# Patient Record
Sex: Male | Born: 1950 | Race: White | Hispanic: No | Marital: Married | Smoking: Former smoker
Health system: Southern US, Community
[De-identification: ages and names within clinical notes are randomized; demographics above are authoritative.]

## PROBLEM LIST (undated history)

## (undated) DIAGNOSIS — J449 Chronic obstructive pulmonary disease, unspecified: Secondary | ICD-10-CM

## (undated) DIAGNOSIS — K219 Gastro-esophageal reflux disease without esophagitis: Secondary | ICD-10-CM

## (undated) DIAGNOSIS — M1990 Unspecified osteoarthritis, unspecified site: Secondary | ICD-10-CM

## (undated) HISTORY — PX: OTHER SURGICAL HISTORY: SHX169

## (undated) HISTORY — PX: APPENDECTOMY: SHX54

---

## 2007-02-12 ENCOUNTER — Ambulatory Visit (HOSPITAL_COMMUNITY): Admission: RE | Admit: 2007-02-12 | Discharge: 2007-02-12 | Payer: Self-pay | Admitting: General Surgery

## 2010-02-16 ENCOUNTER — Ambulatory Visit (HOSPITAL_COMMUNITY): Admission: RE | Admit: 2010-02-16 | Discharge: 2010-02-16 | Payer: Self-pay | Admitting: Urology

## 2010-08-22 ENCOUNTER — Ambulatory Visit (HOSPITAL_COMMUNITY)
Admission: RE | Admit: 2010-08-22 | Discharge: 2010-08-22 | Payer: Self-pay | Source: Home / Self Care | Attending: Urology | Admitting: Urology

## 2011-01-17 NOTE — H&P (Signed)
Darren Rivers, Darren NO.:  [id number]   MEDICAL RECORD NO.:  [id number]          PATIENT TYPE:  AMB   LOCATION:                                FACILITY:  [facility]   PHYSICIAN:  Dalia Heading, M.D.  DATE OF BIRTH:  02/01/1951   DATE OF ADMISSION:  02/12/2007  DATE OF DISCHARGE:  LH                              HISTORY & PHYSICAL   CHIEF COMPLAINT:  Dysphagia, GERD.   HISTORY OF PRESENT ILLNESS:  The patient is a 60 year old white male who  is referred for endoscopic evaluation.  He needs an EGD for recurrent  dysphagia and GERD.  No fever, chills, weight loss, melena, diarrhea, or  constipation have been noted.  He last had an EGD in 2004 and states she  was doing well until the last few months.  He feels like something is  getting stuck in his throat.   PAST MEDICAL HISTORY:  Remarkable for hypercholesterolemia.   PAST SURGICAL HISTORY:  Knee surgery, shoulder surgery, and foot  surgeries in the past.   CURRENT MEDICATIONS:  Prilosec.   ALLERGIES:  No known drug allergies.   REVIEW OF SYSTEMS:  The patient does smoke a pack of cigarettes a day.  He denies any significant alcohol use.  He denies any other  cardiopulmonary difficulties or bleeding disorders.   PHYSICAL EXAMINATION:  GENERAL:  The patient is a well-developed, well-  nourished white male in no acute distress.  LUNGS:  Clear to auscultation with equal breath sounds bilaterally.  HEART:  A regular rate and rhythm without S3, S4, or murmurs.  ABDOMEN:  Soft, nontender, nondistended.  No hepatosplenomegaly or  masses are noted.   IMPRESSION:  1. Dysphagia.  2. Gastroesophageal reflux disease.   PLAN:  The patient is scheduled for an EGD on February 12, 2007.  The risks  and benefits of the procedure including bleeding and perforation were  fully explained to the patient, who gave informed consent.      Dalia Heading, M.D.  Electronically Signed     MAJ/MEDQ  D:  01/24/2007  T:   01/25/2007  Job:  [id number]   cc:   [facility]  Fax: [phone]   Selinda Flavin  Fax: [phone]

## 2011-02-21 ENCOUNTER — Ambulatory Visit (HOSPITAL_COMMUNITY)
Admission: RE | Admit: 2011-02-21 | Discharge: 2011-02-21 | Disposition: A | Discharge status: Home / Self Care | Payer: BC Managed Care – PPO | Source: Ambulatory Visit | Attending: Urology | Admitting: Urology

## 2011-02-21 ENCOUNTER — Other Ambulatory Visit (HOSPITAL_COMMUNITY): Payer: Self-pay | Admitting: Urology

## 2011-02-21 DIAGNOSIS — N200 Calculus of kidney: Secondary | ICD-10-CM

## 2011-02-21 DIAGNOSIS — R109 Unspecified abdominal pain: Secondary | ICD-10-CM | POA: Insufficient documentation

## 2011-06-22 LAB — CLOTEST (H. PYLORI), BIOPSY: Helicobacter screen: NEGATIVE

## 2011-07-04 IMAGING — CR DG ABDOMEN 1V
2 series · 2 of 2 positions shown · non-contrast
Comparison: None

CLINICAL DATA: Left ureteral and bilateral renal calculi

ABDOMEN - 1 VIEW

[view not recorded (1 of 2)]
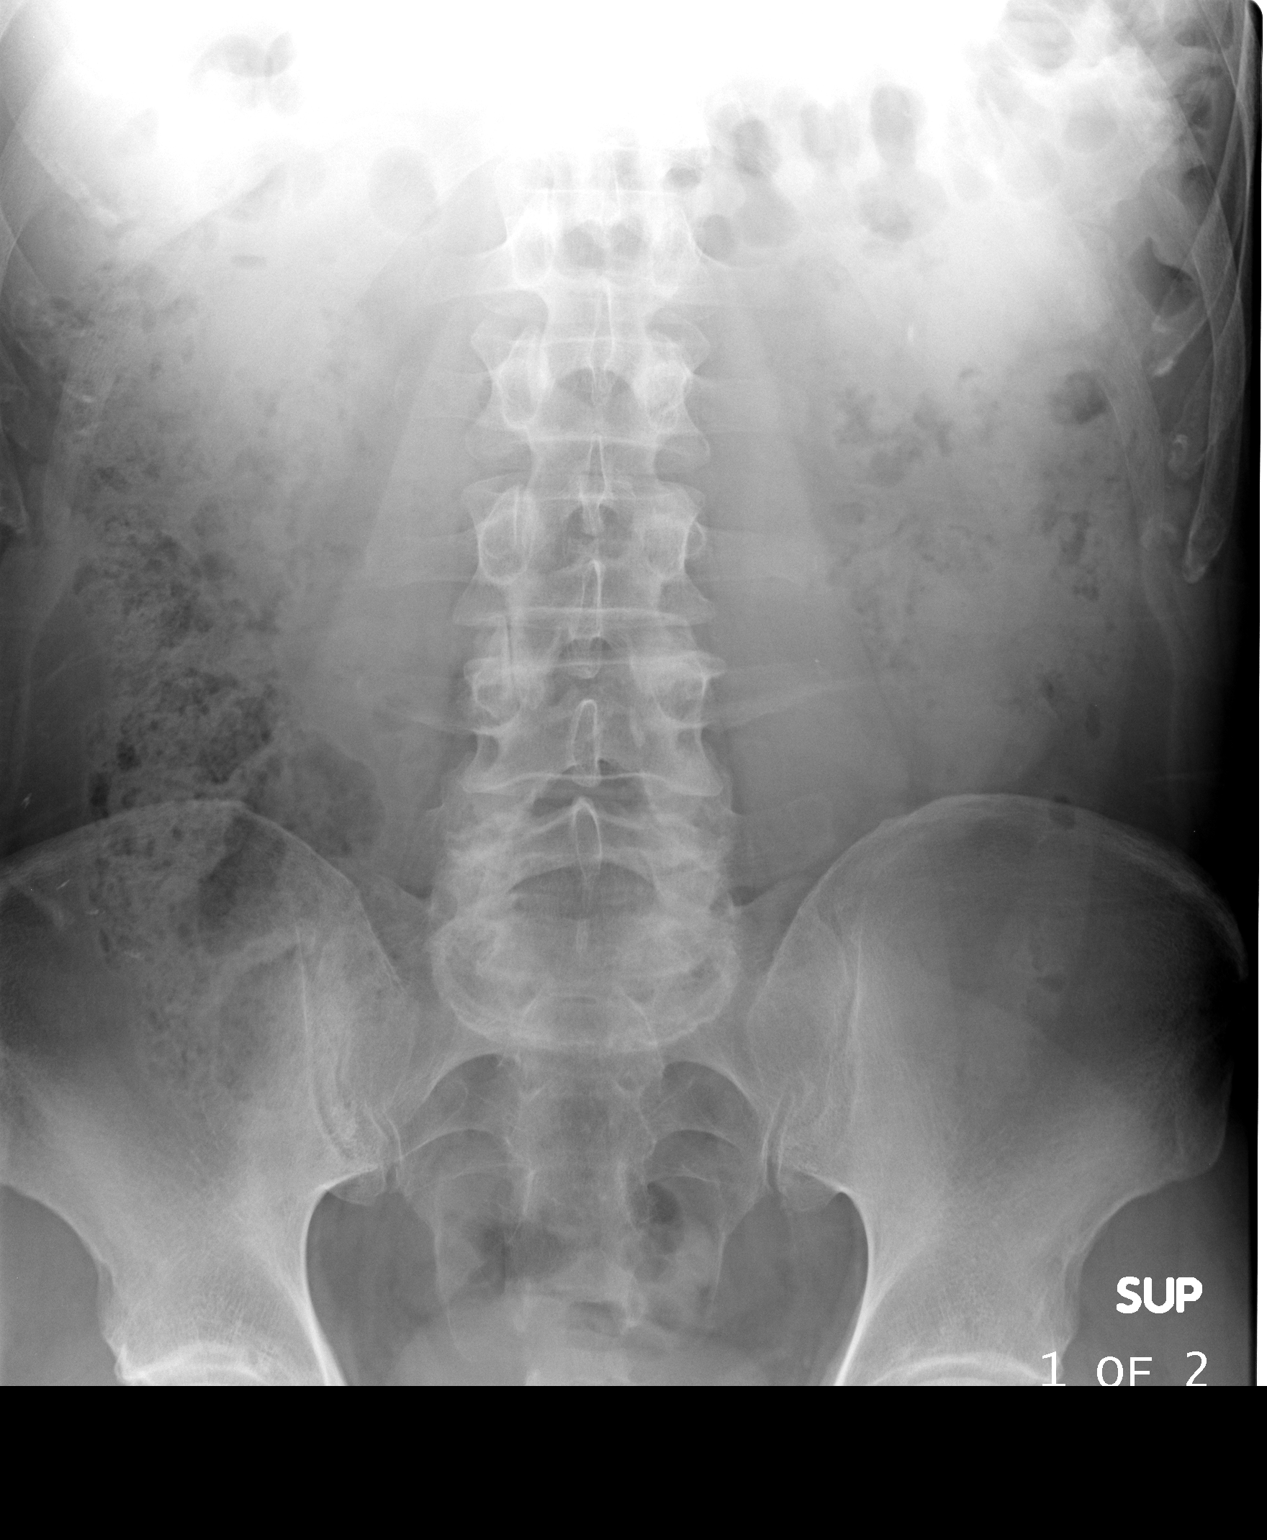

[view not recorded (2 of 2)]
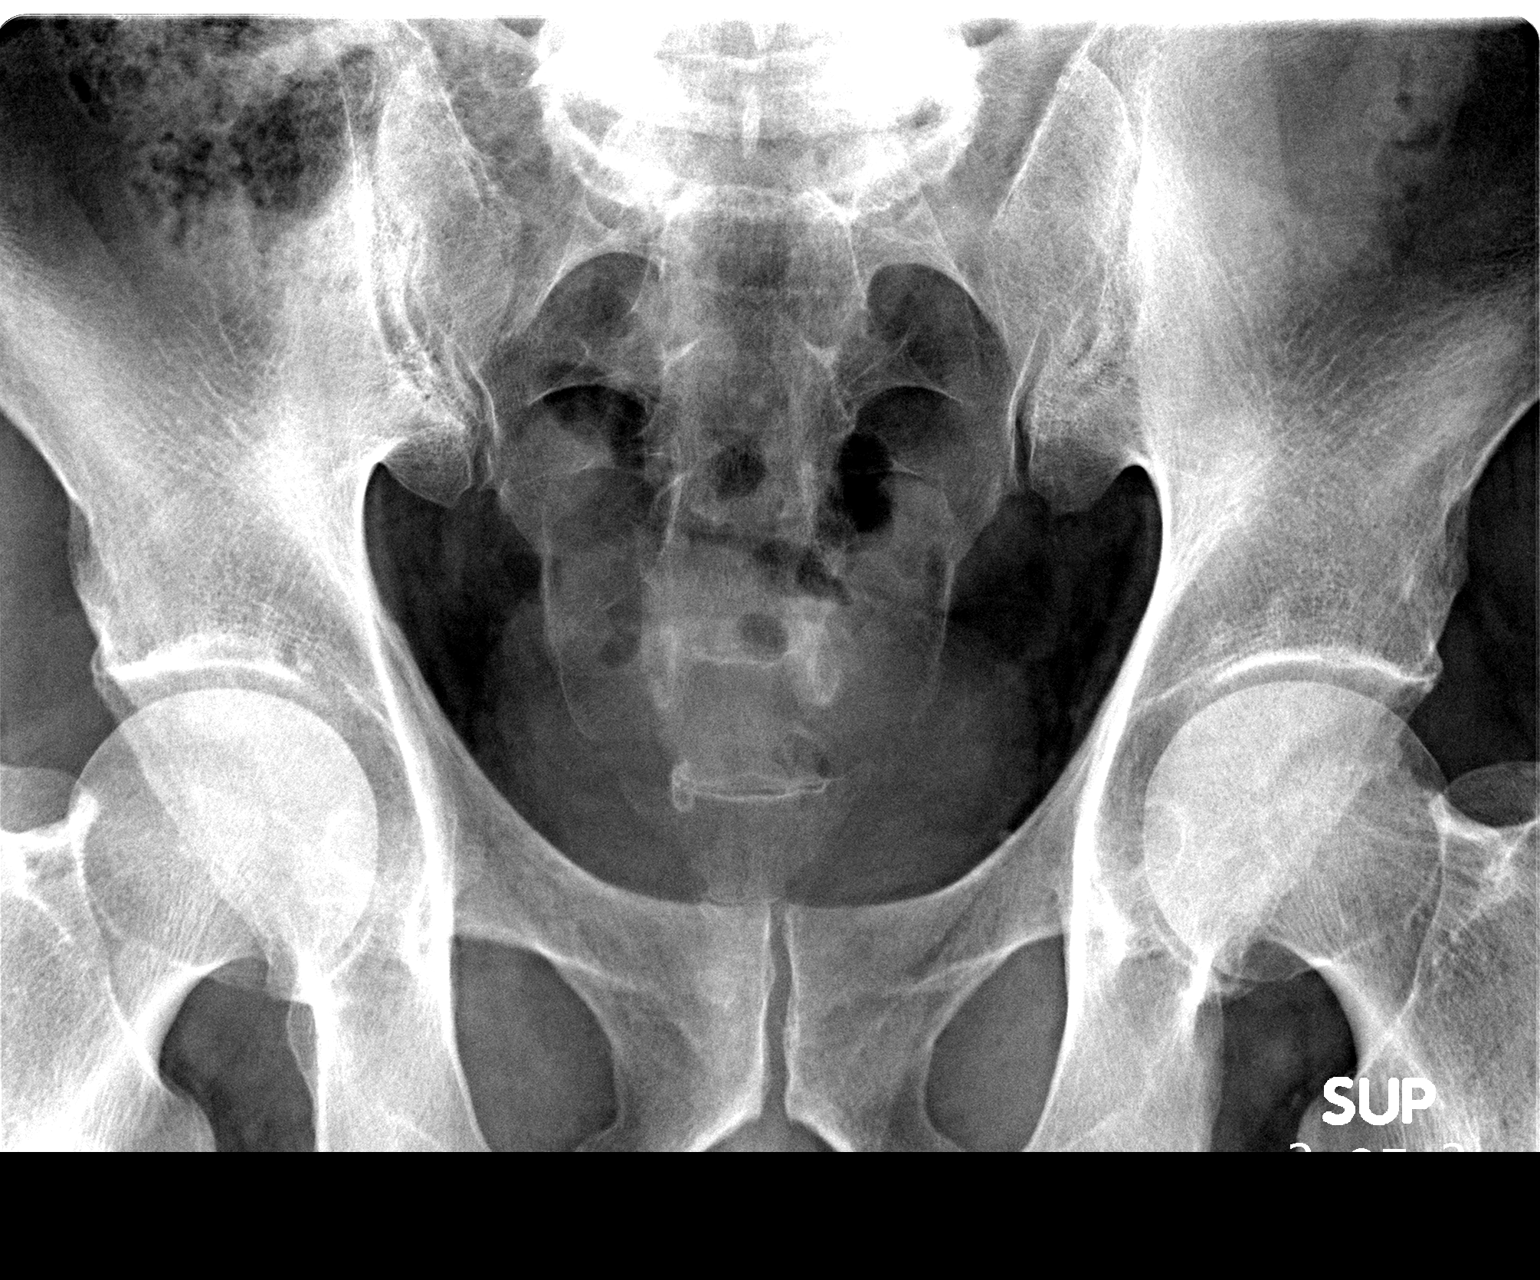

[2 of 2 positions shown; findings below may reference images not displayed]

FINDINGS: 5.2 x 1.4 mm diameter calculus left kidney.
No definite right-sided renal calcification seen.
Small pelvic phleboliths.
No ureteral calcifications identified.
Bones appear demineralized.
Bowel gas pattern normal.
IMPRESSION: 5.2 x 1.4 mm diameter left renal calculus.
No other definite urinary tract calcifications are seen.

## 2011-09-12 ENCOUNTER — Ambulatory Visit (HOSPITAL_COMMUNITY)
Admission: RE | Admit: 2011-09-12 | Discharge: 2011-09-12 | Disposition: A | Discharge status: Home / Self Care | Payer: BC Managed Care – PPO | Source: Ambulatory Visit | Attending: Urology | Admitting: Urology

## 2011-09-12 ENCOUNTER — Other Ambulatory Visit (HOSPITAL_COMMUNITY): Payer: Self-pay | Admitting: Urology

## 2011-09-12 DIAGNOSIS — N200 Calculus of kidney: Secondary | ICD-10-CM

## 2011-09-12 DIAGNOSIS — R109 Unspecified abdominal pain: Secondary | ICD-10-CM | POA: Insufficient documentation

## 2012-07-08 IMAGING — CR DG ABDOMEN 1V
1 series · 1 of 1 positions shown · non-contrast
Comparison: 08/22/2010

CLINICAL DATA: Bilateral renal calculi, follow-up

ABDOMEN - 1 VIEW

[view not recorded]
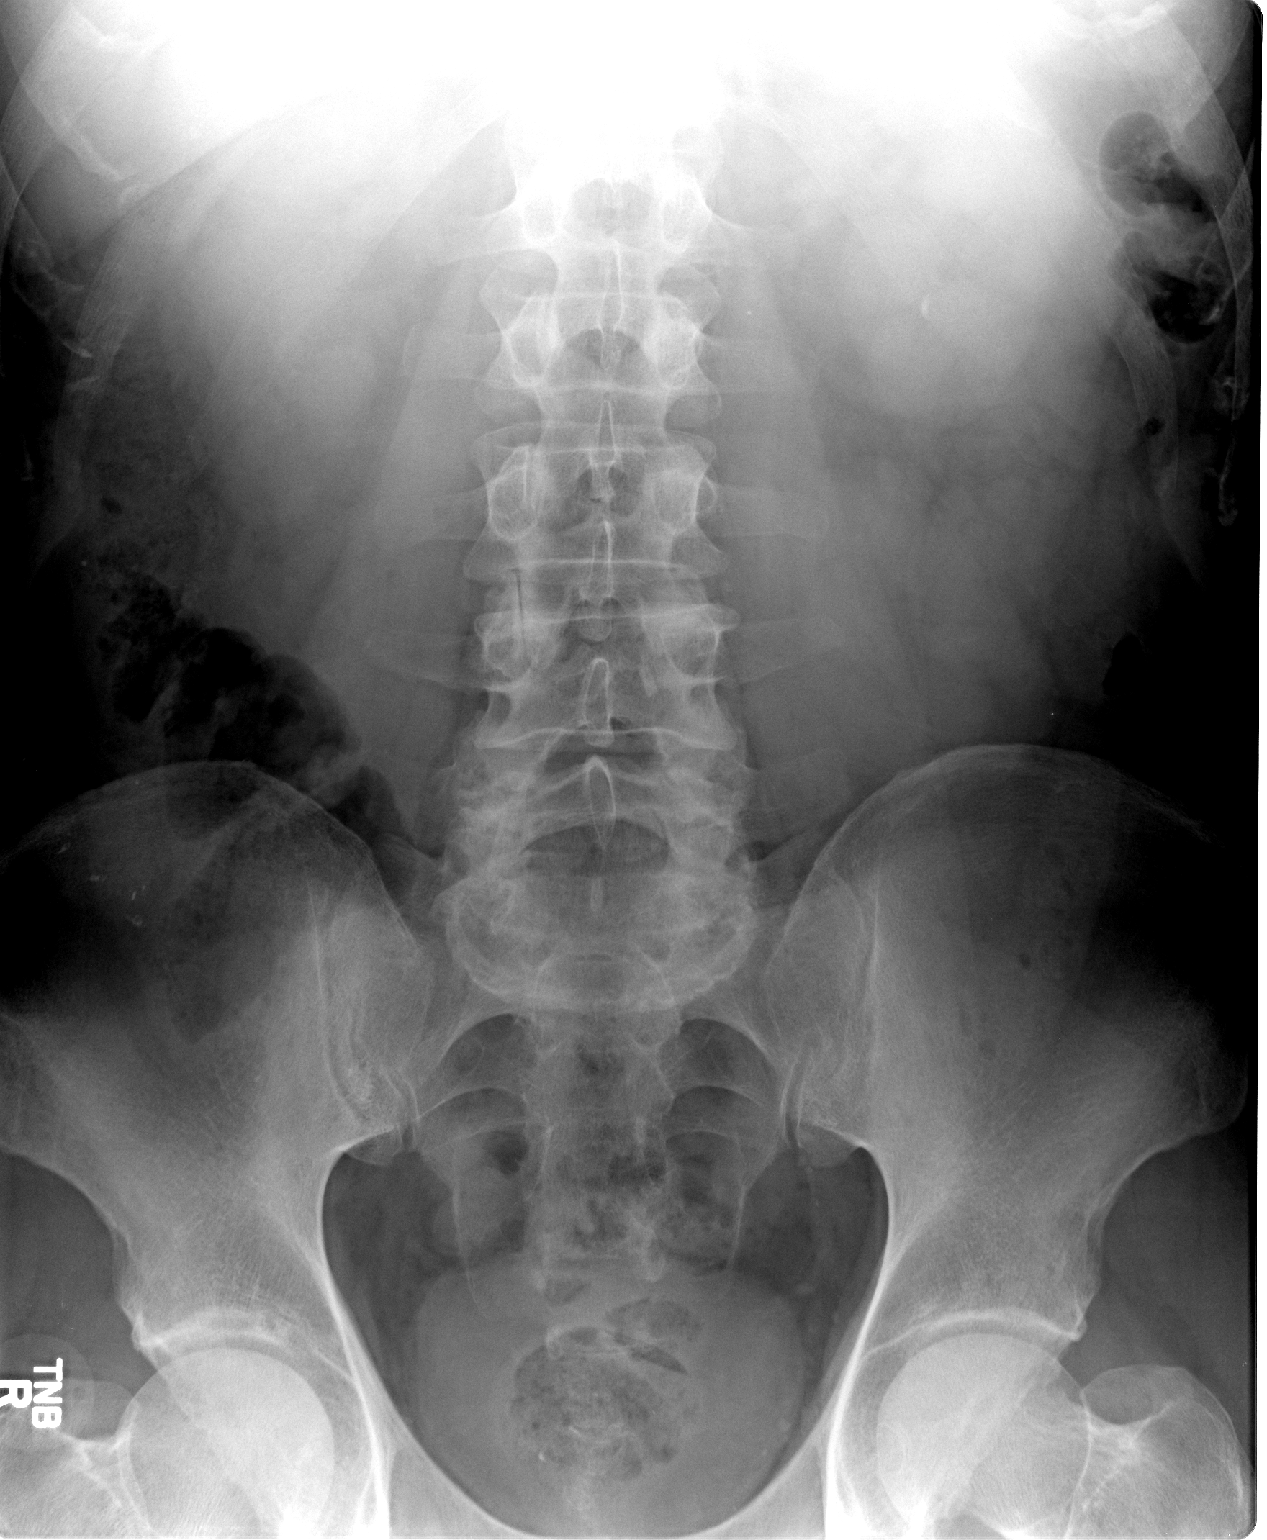

[1 of 1 positions shown; findings below may reference images not displayed]

FINDINGS: Bilateral pelvic phleboliths.
Tiny left paraspinal calcification superior endplate L2 unchanged,
likely extra urinary.
7 mm diameter calculus lower pole left kidney noted.
Questionable 2 mm diameter vague calculus mid right kidney versus
bowel artifact.
No potential ureteral calcification seen.
Bowel gas pattern normal.
Osseous structures unremarkable.
IMPRESSION: 7 mm left renal calculus.
Questionable 2 mm right renal calculus.

## 2015-05-11 ENCOUNTER — Ambulatory Visit: Payer: Self-pay | Admitting: Orthopedic Surgery

## 2015-05-11 NOTE — Progress Notes (Signed)
Preoperative surgical orders have been place into the [facility] hospital system for [facility] on 05/11/2015, 7:55 AM  by Mickel Crow for surgery on 05-31-2015.  Preop Total Knee orders including Experal, IV Tylenol, and IV Decadron as long as there are no contraindications to the above medications. Arlee Muslim, PA-C

## 2015-05-24 NOTE — Patient Instructions (Signed)
YOUR PROCEDURE IS SCHEDULED ON :  05/31/15  REPORT TO [facility] MAIN ENTRANCE FOLLOW SIGNS TO EAST ELEVATOR - GO TO 3rd FLOOR CHECK IN AT 3 EAST NURSES STATION (SHORT STAY) AT:  5:15 AM  CALL THIS NUMBER IF YOU HAVE PROBLEMS THE MORNING OF SURGERY [phone]  REMEMBER:ONLY 1 PER PERSON MAY GO TO SHORT STAY WITH YOU TO GET READY THE MORNING OF YOUR SURGERY  DO NOT EAT FOOD OR DRINK LIQUIDS AFTER MIDNIGHT  TAKE THESE MEDICINES THE MORNING OF SURGERY:  YOU MAY NOT HAVE ANY METAL ON YOUR BODY INCLUDING HAIR PINS AND PIERCING'S. DO NOT WEAR JEWELRY, MAKEUP, LOTIONS, POWDERS OR PERFUMES. DO NOT WEAR NAIL POLISH. DO NOT SHAVE 48 HRS PRIOR TO SURGERY. MEN MAY SHAVE FACE AND NECK.  DO NOT [place]. [place] IS NOT RESPONSIBLE FOR VALUABLES.  CONTACTS, DENTURES OR PARTIALS MAY NOT BE WORN TO SURGERY. LEAVE SUITCASE IN CAR. CAN BE BROUGHT TO ROOM AFTER SURGERY.  PATIENTS DISCHARGED THE DAY OF SURGERY WILL NOT BE ALLOWED TO DRIVE HOME.  PLEASE READ OVER THE FOLLOWING INSTRUCTION SHEETS _________________________________________________________________________________                                          [place] - PREPARING FOR SURGERY  Before surgery, you can play an important role.  Because skin is not sterile, your skin needs to be as free of germs as possible.  You can reduce the number of germs on your skin by washing with CHG (chlorahexidine gluconate) soap before surgery.  CHG is an antiseptic cleaner which kills germs and bonds with the skin to continue killing germs even after washing. Please DO NOT use if you have an allergy to CHG or antibacterial soaps.  If your skin becomes reddened/irritated stop using the CHG and inform your nurse when you arrive at Short Stay. Do not shave (including legs and underarms) for at least 48 hours prior to the first CHG shower.  You may shave your face. Please follow these instructions carefully:   1.   Shower with CHG Soap the night before surgery and the  morning of Surgery.   2.  If you choose to wash your hair, wash your hair first as usual with your  normal  Shampoo.   3.  After you shampoo, rinse your hair and body thoroughly to remove the  shampoo.                                         4.  Use CHG as you would any other liquid soap.  You can apply chg directly  to the skin and wash . Gently wash with scrungie or clean wascloth    5.  Apply the CHG Soap to your body ONLY FROM THE NECK DOWN.   Do not use on open                           Wound or open sores. Avoid contact with eyes, ears mouth and genitals (private parts).                        Genitals (private parts) with your normal soap.  6.  Wash thoroughly, paying special attention to the area where your surgery  will be performed.   7.  Thoroughly rinse your body with warm water from the neck down.   8.  DO NOT shower/wash with your normal soap after using and rinsing off  the CHG Soap .                9.  Pat yourself dry with a clean towel.             10.  Wear clean night clothes to bed after shower             11.  Place clean sheets on your bed the night of your first shower and do not  sleep with pets.  Day of Surgery : Do not apply any lotions/deodorants the morning of surgery.  Please wear clean clothes to the hospital/surgery center.  FAILURE TO FOLLOW THESE INSTRUCTIONS MAY RESULT IN THE CANCELLATION OF YOUR SURGERY    PATIENT SIGNATURE_________________________________  ______________________________________________________________________     Adam Phenix  An incentive spirometer is a tool that can help keep your lungs clear and active. This tool measures how well you are filling your lungs with each breath. Taking long deep breaths may help reverse or decrease the chance of developing breathing (pulmonary) problems (especially infection) following:  A long period of time  when you are unable to move or be active. BEFORE THE PROCEDURE   If the spirometer includes an indicator to show your best effort, your nurse or respiratory therapist will set it to a desired goal.  If possible, sit up straight or lean slightly forward. Try not to slouch.  Hold the incentive spirometer in an upright position. INSTRUCTIONS FOR USE   Sit on the edge of your bed if possible, or sit up as far as you can in bed or on a chair.  Hold the incentive spirometer in an upright position.  Breathe out normally.  Place the mouthpiece in your mouth and seal your lips tightly around it.  Breathe in slowly and as deeply as possible, raising the piston or the ball toward the top of the column.  Hold your breath for 3-5 seconds or for as long as possible. Allow the piston or ball to fall to the bottom of the column.  Remove the mouthpiece from your mouth and breathe out normally.  Rest for a few seconds and repeat Steps 1 through 7 at least 10 times every 1-2 hours when you are awake. Take your time and take a few normal breaths between deep breaths.  The spirometer may include an indicator to show your best effort. Use the indicator as a goal to work toward during each repetition.  After each set of 10 deep breaths, practice coughing to be sure your lungs are clear. If you have an incision (the cut made at the time of surgery), support your incision when coughing by placing a pillow or rolled up towels firmly against it. Once you are able to get out of bed, walk around indoors and cough well. You may stop using the incentive spirometer when instructed by your caregiver.  RISKS AND COMPLICATIONS  Take your time so you do not get dizzy or light-headed.  If you are in pain, you may need to take or ask for pain medication before doing incentive spirometry. It is harder to take a deep breath if you are having pain. AFTER USE  Rest and breathe slowly and easily.  It can be helpful to  keep track of a log of your progress. Your caregiver can provide you with a simple table to help with this. If you are using the spirometer at home, follow these instructions: [place] IF:   You are having difficultly using the spirometer.  You have trouble using the spirometer as often as instructed.  Your pain medication is not giving enough relief while using the spirometer.  You develop fever of 100.5 F (38.1 C) or higher. SEEK IMMEDIATE MEDICAL CARE IF:   You cough up bloody sputum that had not been present before.  You develop fever of 102 F (38.9 C) or greater.  You develop worsening pain at or near the incision site. MAKE SURE YOU:   Understand these instructions.  Will watch your condition.  Will get help right away if you are not doing well or get worse. Document Released: 01/01/2007 Document Revised: 11/13/2011 Document Reviewed: 03/04/2007 [employer] Patient Information 2014 [employer], [place].   ________________________________________________________________________  WHAT IS A BLOOD TRANSFUSION? Blood Transfusion Information  A transfusion is the replacement of blood or some of its parts. Blood is made up of multiple cells which provide different functions.  Red blood cells carry oxygen and are used for blood loss replacement.  White blood cells fight against infection.  Platelets control bleeding.  Plasma helps clot blood.  Other blood products are available for specialized needs, such as hemophilia or other clotting disorders. BEFORE THE TRANSFUSION  Who gives blood for transfusions?   Healthy volunteers who are fully evaluated to make sure their blood is safe. This is blood bank blood. Transfusion therapy is the safest it has ever been in the practice of medicine. Before blood is taken from a donor, a complete history is taken to make sure that person has no history of diseases nor engages in risky social behavior (examples are intravenous drug  use or sexual activity with multiple partners). The donor's travel history is screened to minimize risk of transmitting infections, such as malaria. The donated blood is tested for signs of infectious diseases, such as HIV and hepatitis. The blood is then tested to be sure it is compatible with you in order to minimize the chance of a transfusion reaction. If you or a relative donates blood, this is often done in anticipation of surgery and is not appropriate for emergency situations. It takes many days to process the donated blood. RISKS AND COMPLICATIONS Although transfusion therapy is very safe and saves many lives, the main dangers of transfusion include:   Getting an infectious disease.  Developing a transfusion reaction. This is an allergic reaction to something in the blood you were given. Every precaution is taken to prevent this. The decision to have a blood transfusion has been considered carefully by your caregiver before blood is given. Blood is not given unless the benefits outweigh the risks. AFTER THE TRANSFUSION  Right after receiving a blood transfusion, you will usually feel much better and more energetic. This is especially true if your red blood cells have gotten low (anemic). The transfusion raises the level of the red blood cells which carry oxygen, and this usually causes an energy increase.  The nurse administering the transfusion will monitor you carefully for complications. HOME CARE INSTRUCTIONS  No special instructions are needed after a transfusion. You may find your energy is better. Speak with your caregiver about any limitations on activity for underlying diseases you may have. SEEK MEDICAL CARE IF:   Your  condition is not improving after your transfusion.  You develop redness or irritation at the intravenous (IV) site. SEEK IMMEDIATE MEDICAL CARE IF:  Any of the following symptoms occur over the next 12 hours:  Shaking chills.  You have a temperature by mouth  above 102 F (38.9 C), not controlled by medicine.  Chest, back, or muscle pain.  People around you feel you are not acting correctly or are confused.  Shortness of breath or difficulty breathing.  Dizziness and fainting.  You get a rash or develop hives.  You have a decrease in urine output.  Your urine turns a dark color or changes to pink, red, or brown. Any of the following symptoms occur over the next 10 days:  You have a temperature by mouth above 102 F (38.9 C), not controlled by medicine.  Shortness of breath.  Weakness after normal activity.  The white part of the eye turns yellow (jaundice).  You have a decrease in the amount of urine or are urinating less often.  Your urine turns a dark color or changes to pink, red, or brown. Document Released: 08/18/2000 Document Revised: 11/13/2011 Document Reviewed: 04/06/2008 [facility] [place] Patient Information 2014 [place], [place].  _______________________________________________________________________

## 2015-05-25 ENCOUNTER — Inpatient Hospital Stay (HOSPITAL_COMMUNITY)
Admission: RE | Admit: 2015-05-25 | Discharge: 2015-05-25 | Disposition: A | Discharge status: Home / Self Care | Payer: PRIVATE HEALTH INSURANCE | Source: Ambulatory Visit

## 2015-05-25 NOTE — Patient Instructions (Signed)
Darren Rivers  05/25/2015   Your procedure is scheduled on:   05/31/2015    Report to [facility] Main  Entrance take [facility]  elevators to 3rd floor to  [place] at      0600 AM.  Call this number if you have problems the morning of surgery [phone]   Remember: ONLY 1 PERSON MAY GO WITH YOU TO SHORT STAY TO GET  READY MORNING OF [place].  Do not eat food or drink liquids :After Midnight.     Take these medicines the morning of surgery with A SIP OF WATER: Cosopt eye drops, Prilosec                                You may not have any metal on your body including hair pins and              piercings  Do not wear jewelry,  lotions, powders or perfumes, deodorant                        Men may shave face and neck.   Do not bring valuables to the hospital. [place].  Contacts, dentures or bridgework may not be worn into surgery.  Leave suitcase in the car. After surgery it may be brought to your room.       Special Instructions: coughing and deep breathing exercises, leg exercises               Please read over the following fact sheets you were given: _____________________________________________________________________             [facility] - Preparing for Surgery Before surgery, you can play an important role.  Because skin is not sterile, your skin needs to be as free of germs as possible.  You can reduce the number of germs on your skin by washing with CHG (chlorahexidine gluconate) soap before surgery.  CHG is an antiseptic cleaner which kills germs and bonds with the skin to continue killing germs even after washing. Please DO NOT use if you have an allergy to CHG or antibacterial soaps.  If your skin becomes reddened/irritated stop using the CHG and inform your nurse when you arrive at Short Stay. Do not shave (including legs and underarms) for at least 48 hours prior to the first CHG  shower.  You may shave your face/neck. Please follow these instructions carefully:  1.  Shower with CHG Soap the night before surgery and the  morning of Surgery.  2.  If you choose to wash your hair, wash your hair first as usual with your  normal  shampoo.  3.  After you shampoo, rinse your hair and body thoroughly to remove the  shampoo.                           4.  Use CHG as you would any other liquid soap.  You can apply chg directly  to the skin and wash                       Gently with a scrungie or clean washcloth.  5.  Apply the CHG Soap to your body ONLY FROM THE NECK DOWN.   Do not use on face/ open                           Wound or open sores. Avoid contact with eyes, ears mouth and genitals (private parts).                       Wash face,  Genitals (private parts) with your normal soap.             6.  Wash thoroughly, paying special attention to the area where your surgery  will be performed.  7.  Thoroughly rinse your body with warm water from the neck down.  8.  DO NOT shower/wash with your normal soap after using and rinsing off  the CHG Soap.                9.  Pat yourself dry with a clean towel.            10.  Wear clean pajamas.            11.  Place clean sheets on your bed the night of your first shower and do not  sleep with pets. Day of Surgery : Do not apply any lotions/deodorants the morning of surgery.  Please wear clean clothes to the hospital/surgery center.  FAILURE TO FOLLOW THESE INSTRUCTIONS MAY RESULT IN THE CANCELLATION OF YOUR SURGERY PATIENT SIGNATURE_________________________________  NURSE SIGNATURE__________________________________  ________________________________________________________________________  WHAT IS A BLOOD TRANSFUSION? Blood Transfusion Information  A transfusion is the replacement of blood or some of its parts. Blood is made up of multiple cells which provide different functions.  Red blood cells carry oxygen and are used for  blood loss replacement.  White blood cells fight against infection.  Platelets control bleeding.  Plasma helps clot blood.  Other blood products are available for specialized needs, such as hemophilia or other clotting disorders. BEFORE THE TRANSFUSION  Who gives blood for transfusions?   Healthy volunteers who are fully evaluated to make sure their blood is safe. This is blood bank blood. Transfusion therapy is the safest it has ever been in the practice of medicine. Before blood is taken from a donor, a complete history is taken to make sure that person has no history of diseases nor engages in risky social behavior (examples are intravenous drug use or sexual activity with multiple partners). The donor's travel history is screened to minimize risk of transmitting infections, such as malaria. The donated blood is tested for signs of infectious diseases, such as HIV and hepatitis. The blood is then tested to be sure it is compatible with you in order to minimize the chance of a transfusion reaction. If you or a relative donates blood, this is often done in anticipation of surgery and is not appropriate for emergency situations. It takes many days to process the donated blood. RISKS AND COMPLICATIONS Although transfusion therapy is very safe and saves many lives, the main dangers of transfusion include:  1. Getting an infectious disease. 2. Developing a transfusion reaction. This is an allergic reaction to something in the blood you were given. Every precaution is taken to prevent this. The decision to have a blood transfusion has been considered carefully by your caregiver before blood is given. Blood is not given unless the benefits outweigh the risks. AFTER THE TRANSFUSION  Right after receiving a  blood transfusion, you will usually feel much better and more energetic. This is especially true if your red blood cells have gotten low (anemic). The transfusion raises the level of the red blood  cells which carry oxygen, and this usually causes an energy increase.  The nurse administering the transfusion will monitor you carefully for complications. HOME CARE INSTRUCTIONS  No special instructions are needed after a transfusion. You may find your energy is better. Speak with your caregiver about any limitations on activity for underlying diseases you may have. SEEK MEDICAL CARE IF:   Your condition is not improving after your transfusion.  You develop redness or irritation at the intravenous (IV) site. SEEK IMMEDIATE MEDICAL CARE IF:  Any of the following symptoms occur over the next 12 hours:  Shaking chills.  You have a temperature by mouth above 102 F (38.9 C), not controlled by medicine.  Chest, back, or muscle pain.  People around you feel you are not acting correctly or are confused.  Shortness of breath or difficulty breathing.  Dizziness and fainting.  You get a rash or develop hives.  You have a decrease in urine output.  Your urine turns a dark color or changes to pink, red, or brown. Any of the following symptoms occur over the next 10 days:  You have a temperature by mouth above 102 F (38.9 C), not controlled by medicine.  Shortness of breath.  Weakness after normal activity.  The white part of the eye turns yellow (jaundice).  You have a decrease in the amount of urine or are urinating less often.  Your urine turns a dark color or changes to pink, red, or brown. Document Released: 08/18/2000 Document Revised: 11/13/2011 Document Reviewed: 04/06/2008 ExitCare Patient Information 2014 [place].  _______________________________________________________________________  Incentive Spirometer  An incentive spirometer is a tool that can help keep your lungs clear and active. This tool measures how well you are filling your lungs with each breath. Taking long deep breaths may help reverse or decrease the chance of developing breathing  (pulmonary) problems (especially infection) following:  A long period of time when you are unable to move or be active. BEFORE THE PROCEDURE   If the spirometer includes an indicator to show your best effort, your nurse or respiratory therapist will set it to a desired goal.  If possible, sit up straight or lean slightly forward. Try not to slouch.  Hold the incentive spirometer in an upright position. INSTRUCTIONS FOR USE  3. Sit on the edge of your bed if possible, or sit up as far as you can in bed or on a chair. 4. Hold the incentive spirometer in an upright position. 5. Breathe out normally. 6. Place the mouthpiece in your mouth and seal your lips tightly around it. 7. Breathe in slowly and as deeply as possible, raising the piston or the ball toward the top of the column. 8. Hold your breath for 3-5 seconds or for as long as possible. Allow the piston or ball to fall to the bottom of the column. 9. Remove the mouthpiece from your mouth and breathe out normally. 10. Rest for a few seconds and repeat Steps 1 through 7 at least 10 times every 1-2 hours when you are awake. Take your time and take a few normal breaths between deep breaths. 11. The spirometer may include an indicator to show your best effort. Use the indicator as a goal to work toward during each repetition. 12. After each set of 10  deep breaths, practice coughing to be sure your lungs are clear. If you have an incision (the cut made at the time of surgery), support your incision when coughing by placing a pillow or rolled up towels firmly against it. Once you are able to get out of bed, walk around indoors and cough well. You may stop using the incentive spirometer when instructed by your caregiver.  RISKS AND COMPLICATIONS  Take your time so you do not get dizzy or light-headed.  If you are in pain, you may need to take or ask for pain medication before doing incentive spirometry. It is harder to take a deep breath if you  are having pain. AFTER USE  Rest and breathe slowly and easily.  It can be helpful to keep track of a log of your progress. Your caregiver can provide you with a simple table to help with this. If you are using the spirometer at home, follow these instructions: [place] IF:   You are having difficultly using the spirometer.  You have trouble using the spirometer as often as instructed.  Your pain medication is not giving enough relief while using the spirometer.  You develop fever of 100.5 F (38.1 C) or higher. SEEK IMMEDIATE MEDICAL CARE IF:   You cough up bloody sputum that had not been present before.  You develop fever of 102 F (38.9 C) or greater.  You develop worsening pain at or near the incision site. MAKE SURE YOU:   Understand these instructions.  Will watch your condition.  Will get help right away if you are not doing well or get worse. Document Released: 01/01/2007 Document Revised: 11/13/2011 Document Reviewed: 03/04/2007 [facility] Patient Information 2014 [place], [place].   ________________________________________________________________________

## 2015-05-26 NOTE — H&P (Signed)
TOTAL KNEE ADMISSION H&P  Patient is being admitted for left total knee arthroplasty.  Subjective:  Chief Complaint:left knee pain.  HPI: Darren Rivers, 64 y.o. male, has a history of pain and functional disability in the left knee due to arthritis and has failed non-surgical conservative treatments for greater than 12 weeks to includeNSAID's and/or analgesics, corticosteriod injections and activity modification.  Onset of symptoms was gradual, starting >10 years ago with gradually worsening course since that time. The patient noted prior procedures on the knee to include  arthroscopy and menisectomy on the left knee(s).  Patient currently rates pain in the left knee(s) at 7 out of 10 with activity. Patient has night pain, worsening of pain with activity and weight bearing, pain that interferes with activities of daily living, pain with passive range of motion, crepitus and joint swelling.  Patient has evidence of periarticular osteophytes, joint subluxation and joint space narrowing by imaging studies.  There is no active infection.   Past Surgical History  Rotator Cuff Repair right Tonsillectomy Vasectomy Foot Surgery bilateral Appendectomy Arthroscopy of Knee bilateral Colon Polyp Removal - Colonoscopy  Past Medical History Emphysema Of Lung Kidney Stone Glaucoma Hyperlipidemia GERD Varicose Veins Osteoarthritis   Current outpatient prescriptions:  .  aspirin 81 MG tablet, Take 81 mg by mouth daily., Disp: , Rfl:  .  atorvastatin (LIPITOR) 40 MG tablet, Take 20 mg by mouth daily., Disp: , Rfl: 3 .  dorzolamide-timolol (COSOPT) 22.3-6.8 MG/ML ophthalmic solution, Place 1 drop into both eyes 2 (two) times daily., Disp: , Rfl: 99 .  omeprazole (PRILOSEC) 20 MG capsule, Take 20 mg by mouth daily., Disp: , Rfl: 3   No Known Allergies  Social History  Substance Use Topics  . Smoking status: Past  . Smokeless tobacco: No  . Alcohol Use: "occasional" social        Review of Systems  Constitutional: Negative.   HENT: Positive for hearing loss. Negative for congestion, ear discharge, ear pain, nosebleeds, sore throat and tinnitus.   Eyes: Negative.   Respiratory: Positive for shortness of breath. Negative for cough, hemoptysis, sputum production, wheezing and stridor.        SOB with exertion  Cardiovascular: Negative.   Gastrointestinal: Positive for heartburn. Negative for nausea, vomiting, abdominal pain, diarrhea, constipation, blood in stool and melena.  Genitourinary: Negative.   Musculoskeletal: Positive for joint pain. Negative for myalgias, back pain, falls and neck pain.       Left knee pain  Skin: Negative.   Neurological: Negative.  Negative for headaches.  Endo/Heme/Allergies: Negative.   Psychiatric/Behavioral: Negative.     Objective:  Physical Exam  Constitutional: He is oriented to person, place, and time. He appears well-developed and well-nourished. No distress.  HENT:  Head: Normocephalic and atraumatic.  Right Ear: External ear normal.  Left Ear: External ear normal.  Nose: Nose normal.  Mouth/Throat: Oropharynx is clear and moist.  Eyes: Conjunctivae and EOM are normal.  Neck: Normal range of motion. Neck supple.  Cardiovascular: Normal rate, regular rhythm, normal heart sounds and intact distal pulses.   No murmur heard. Respiratory: Effort normal and breath sounds normal. No respiratory distress. He has no wheezes.  GI: Soft. Bowel sounds are normal. He exhibits no distension. There is no tenderness.  Musculoskeletal:  Evaluation of his hip show normal range motion, no discomfort. Right knee, no effusion. Right knee range is 0 to 135, with no tenderness or instability noted. Left knee, trace effusion, range about 5 to 125,  marked crepitus on range of motion, diffuse tenderness about the knee and fairly significant AP laxity with some slight varus and valgus laxity also  Neurological: He is alert and oriented to  person, place, and time. He has normal strength and normal reflexes. No sensory deficit.  Skin: No rash noted. He is not diaphoretic. No erythema.  Psychiatric: He has a normal mood and affect. His behavior is normal.     Vitals  Weight: 180 lb Height: 72in Body Surface Area: 2.04 m Body Mass Index: 24.41 kg/m  Pulse: 64 (Regular)  BP: 118/74 (Sitting, Left Arm, Standard)  Imaging Review Plain radiographs demonstrate severe degenerative joint disease of the left knee(s). The overall alignment ismild varus. The bone quality appears to be good for age and reported activity level.  Assessment/Plan:  End stage primary osteoarthritis, left knee   The patient history, physical examination, clinical judgment of the provider and imaging studies are consistent with end stage degenerative joint disease of the left knee(s) and total knee arthroplasty is deemed medically necessary. The treatment options including medical management, injection therapy arthroscopy and arthroplasty were discussed at length. The risks and benefits of total knee arthroplasty were presented and reviewed. The risks due to aseptic loosening, infection, stiffness, patella tracking problems, thromboembolic complications and other imponderables were discussed. The patient acknowledged the explanation, agreed to proceed with the plan and consent was signed. Patient is being admitted for inpatient treatment for surgery, pain control, PT, OT, prophylactic antibiotics, VTE prophylaxis, progressive ambulation and ADL's and discharge planning. The patient is planning to be discharged home with home health services    TXA IV Needs walker and shower chair  PCP: Dr. Rory Percy    Ardeen Jourdain, PA-C

## 2015-05-27 ENCOUNTER — Encounter (HOSPITAL_COMMUNITY)
Admission: RE | Admit: 2015-05-27 | Discharge: 2015-05-27 | Disposition: A | Discharge status: Home / Self Care | Payer: BLUE CROSS/BLUE SHIELD | Source: Ambulatory Visit | Attending: Orthopedic Surgery | Admitting: Orthopedic Surgery

## 2015-05-27 ENCOUNTER — Encounter (HOSPITAL_COMMUNITY): Payer: Self-pay

## 2015-05-27 DIAGNOSIS — Z01818 Encounter for other preprocedural examination: Secondary | ICD-10-CM | POA: Diagnosis present

## 2015-05-27 DIAGNOSIS — M179 Osteoarthritis of knee, unspecified: Secondary | ICD-10-CM | POA: Diagnosis not present

## 2015-05-27 HISTORY — DX: Chronic obstructive pulmonary disease, unspecified: J44.9

## 2015-05-27 HISTORY — DX: Unspecified osteoarthritis, unspecified site: M19.90

## 2015-05-27 HISTORY — DX: Gastro-esophageal reflux disease without esophagitis: K21.9

## 2015-05-27 LAB — URINALYSIS, ROUTINE W REFLEX MICROSCOPIC
BILIRUBIN URINE: NEGATIVE
GLUCOSE, UA: NEGATIVE mg/dL
Hgb urine dipstick: NEGATIVE
KETONES UR: NEGATIVE mg/dL
Leukocytes, UA: NEGATIVE
NITRITE: NEGATIVE
PH: 6.5 (ref 5.0–8.0)
PROTEIN: NEGATIVE mg/dL
Specific Gravity, Urine: 1.005 (ref 1.005–1.030)
Urobilinogen, UA: 0.2 mg/dL (ref 0.0–1.0)

## 2015-05-27 LAB — COMPREHENSIVE METABOLIC PANEL
ALBUMIN: 4.5 g/dL (ref 3.5–5.0)
ALT: 24 U/L (ref 17–63)
AST: 29 U/L (ref 15–41)
Alkaline Phosphatase: 79 U/L (ref 38–126)
Anion gap: 8 (ref 5–15)
BUN: 9 mg/dL (ref 6–20)
CHLORIDE: 107 mmol/L (ref 101–111)
CO2: 25 mmol/L (ref 22–32)
CREATININE: 0.94 mg/dL (ref 0.61–1.24)
Calcium: 9.6 mg/dL (ref 8.9–10.3)
GFR calc Af Amer: >60 mL/min (ref >60)
Glucose, Bld: 88 mg/dL (ref 65–99)
POTASSIUM: 4.7 mmol/L (ref 3.5–5.1)
SODIUM: 140 mmol/L (ref 135–145)
Total Bilirubin: 0.7 mg/dL (ref 0.3–1.2)
Total Protein: 7.7 g/dL (ref 6.5–8.1)

## 2015-05-27 LAB — CBC
HCT: 41.9 % (ref 39.0–52.0)
Hemoglobin: 14.2 g/dL (ref 13.0–17.0)
MCH: 33 pg (ref 26.0–34.0)
MCHC: 33.9 g/dL (ref 30.0–36.0)
MCV: 97.4 fL (ref 78.0–100.0)
PLATELETS: 243 10*3/uL (ref 150–400)
RBC: 4.3 MIL/uL (ref 4.22–5.81)
RDW: 13.3 % (ref 11.5–15.5)
WBC: 7.4 10*3/uL (ref 4.0–10.5)

## 2015-05-27 LAB — SURGICAL PCR SCREEN
MRSA, PCR: NEGATIVE
STAPHYLOCOCCUS AUREUS: NEGATIVE

## 2015-05-27 LAB — ABO/RH: ABO/RH(D): O POS

## 2015-05-27 LAB — APTT: APTT: 29 s (ref 24–37)

## 2015-05-27 LAB — PROTIME-INR
INR: 0.98 (ref 0.00–1.49)
PROTHROMBIN TIME: 13.2 s (ref 11.6–15.2)

## 2015-05-31 ENCOUNTER — Inpatient Hospital Stay (HOSPITAL_COMMUNITY)
Admission: RE | Admit: 2015-05-31 | Discharge: 2015-06-01 | DRG: 470 | Disposition: A | Discharge status: Home Health | Payer: BLUE CROSS/BLUE SHIELD | Source: Ambulatory Visit | Attending: Orthopedic Surgery | Admitting: Orthopedic Surgery

## 2015-05-31 ENCOUNTER — Encounter (HOSPITAL_COMMUNITY)
Admission: RE | Disposition: A | Discharge status: Home Health | Payer: Self-pay | Source: Ambulatory Visit | Attending: Orthopedic Surgery

## 2015-05-31 ENCOUNTER — Inpatient Hospital Stay (HOSPITAL_COMMUNITY): Payer: BLUE CROSS/BLUE SHIELD | Admitting: Anesthesiology

## 2015-05-31 ENCOUNTER — Encounter (HOSPITAL_COMMUNITY): Payer: Self-pay | Admitting: *Deleted

## 2015-05-31 DIAGNOSIS — M1710 Unilateral primary osteoarthritis, unspecified knee: Secondary | ICD-10-CM | POA: Diagnosis present

## 2015-05-31 DIAGNOSIS — H409 Unspecified glaucoma: Secondary | ICD-10-CM | POA: Diagnosis present

## 2015-05-31 DIAGNOSIS — Z87442 Personal history of urinary calculi: Secondary | ICD-10-CM

## 2015-05-31 DIAGNOSIS — Z01812 Encounter for preprocedural laboratory examination: Secondary | ICD-10-CM

## 2015-05-31 DIAGNOSIS — Z87891 Personal history of nicotine dependence: Secondary | ICD-10-CM | POA: Diagnosis not present

## 2015-05-31 DIAGNOSIS — E785 Hyperlipidemia, unspecified: Secondary | ICD-10-CM | POA: Diagnosis present

## 2015-05-31 DIAGNOSIS — M1712 Unilateral primary osteoarthritis, left knee: Secondary | ICD-10-CM

## 2015-05-31 DIAGNOSIS — M179 Osteoarthritis of knee, unspecified: Secondary | ICD-10-CM | POA: Diagnosis present

## 2015-05-31 DIAGNOSIS — J449 Chronic obstructive pulmonary disease, unspecified: Secondary | ICD-10-CM | POA: Diagnosis present

## 2015-05-31 DIAGNOSIS — K219 Gastro-esophageal reflux disease without esophagitis: Secondary | ICD-10-CM | POA: Diagnosis present

## 2015-05-31 DIAGNOSIS — M25562 Pain in left knee: Secondary | ICD-10-CM | POA: Diagnosis present

## 2015-05-31 HISTORY — PX: TOTAL KNEE ARTHROPLASTY: SHX125

## 2015-05-31 LAB — TYPE AND SCREEN
ABO/RH(D): O POS
ANTIBODY SCREEN: NEGATIVE

## 2015-05-31 SURGERY — ARTHROPLASTY, KNEE, TOTAL
Anesthesia: Spinal | Site: Knee | Laterality: Left

## 2015-05-31 MED ORDER — OXYCODONE HCL 5 MG PO TABS
5.0000 mg | ORAL_TABLET | ORAL | Status: DC | PRN
  Administered 2015-05-31 – 2015-06-01 (×7): 10 mg via ORAL
  Filled 2015-05-31 (×7): qty 2

## 2015-05-31 MED ORDER — SODIUM CHLORIDE 0.9 % IJ SOLN
INTRAMUSCULAR | Status: DC | PRN
  Administered 2015-05-31: 30 mL

## 2015-05-31 MED ORDER — MORPHINE SULFATE (PF) 2 MG/ML IV SOLN
1.0000 mg | INTRAVENOUS | Status: DC | PRN
  Administered 2015-05-31 (×3): 1 mg via INTRAVENOUS
  Filled 2015-05-31 (×3): qty 1

## 2015-05-31 MED ORDER — PHENOL 1.4 % MT LIQD
1.0000 | OROMUCOSAL | Status: DC | PRN
  Filled 2015-05-31: qty 177

## 2015-05-31 MED ORDER — LACTATED RINGERS IV SOLN
INTRAVENOUS | Status: DC | PRN
  Administered 2015-05-31 (×3): via INTRAVENOUS

## 2015-05-31 MED ORDER — METHOCARBAMOL 1000 MG/10ML IJ SOLN
500.0000 mg | Freq: Four times a day (QID) | INTRAVENOUS | Status: DC | PRN
  Administered 2015-05-31 (×2): 500 mg via INTRAVENOUS
  Filled 2015-05-31 (×3): qty 5

## 2015-05-31 MED ORDER — CEFAZOLIN SODIUM-DEXTROSE 2-3 GM-% IV SOLR
2.0000 g | Freq: Four times a day (QID) | INTRAVENOUS | Status: AC
  Administered 2015-05-31 (×2): 2 g via INTRAVENOUS
  Filled 2015-05-31 (×2): qty 50

## 2015-05-31 MED ORDER — CEFAZOLIN SODIUM-DEXTROSE 2-3 GM-% IV SOLR
INTRAVENOUS | Status: AC
  Filled 2015-05-31: qty 50

## 2015-05-31 MED ORDER — DIPHENHYDRAMINE HCL 12.5 MG/5ML PO ELIX: 12.5000 mg | ORAL_SOLUTION | ORAL | Status: DC | PRN

## 2015-05-31 MED ORDER — MIDAZOLAM HCL 2 MG/2ML IJ SOLN
INTRAMUSCULAR | Status: AC
  Filled 2015-05-31: qty 4

## 2015-05-31 MED ORDER — ATORVASTATIN CALCIUM 20 MG PO TABS
20.0000 mg | ORAL_TABLET | Freq: Every day | ORAL | Status: DC
  Administered 2015-06-01: 20 mg via ORAL
  Filled 2015-05-31: qty 1

## 2015-05-31 MED ORDER — RIVAROXABAN 10 MG PO TABS
10.0000 mg | ORAL_TABLET | Freq: Every day | ORAL | Status: DC
  Administered 2015-06-01: 10 mg via ORAL
  Filled 2015-05-31 (×2): qty 1

## 2015-05-31 MED ORDER — ONDANSETRON HCL 4 MG/2ML IJ SOLN
INTRAMUSCULAR | Status: DC | PRN
  Administered 2015-05-31: 4 mg via INTRAVENOUS

## 2015-05-31 MED ORDER — DOCUSATE SODIUM 100 MG PO CAPS
100.0000 mg | ORAL_CAPSULE | Freq: Two times a day (BID) | ORAL | Status: DC
  Administered 2015-05-31 – 2015-06-01 (×2): 100 mg via ORAL

## 2015-05-31 MED ORDER — FLEET ENEMA 7-19 GM/118ML RE ENEM: 1.0000 | ENEMA | Freq: Once | RECTAL | Status: DC | PRN

## 2015-05-31 MED ORDER — DEXAMETHASONE SODIUM PHOSPHATE 10 MG/ML IJ SOLN
INTRAMUSCULAR | Status: AC
  Filled 2015-05-31: qty 1

## 2015-05-31 MED ORDER — HYDROMORPHONE HCL 1 MG/ML IJ SOLN
0.2500 mg | INTRAMUSCULAR | Status: DC | PRN
  Administered 2015-05-31 (×2): 0.5 mg via INTRAVENOUS

## 2015-05-31 MED ORDER — OXYCODONE HCL 5 MG/5ML PO SOLN: 5.0000 mg | Freq: Once | ORAL | Status: DC | PRN

## 2015-05-31 MED ORDER — BUPIVACAINE HCL (PF) 0.25 % IJ SOLN
INTRAMUSCULAR | Status: AC
  Filled 2015-05-31: qty 30

## 2015-05-31 MED ORDER — MIDAZOLAM HCL 5 MG/5ML IJ SOLN
INTRAMUSCULAR | Status: DC | PRN
  Administered 2015-05-31: 2 mg via INTRAVENOUS

## 2015-05-31 MED ORDER — SODIUM CHLORIDE 0.9 % IV SOLN: INTRAVENOUS | Status: DC

## 2015-05-31 MED ORDER — ACETAMINOPHEN 325 MG PO TABS: 650.0000 mg | ORAL_TABLET | Freq: Four times a day (QID) | ORAL | Status: DC | PRN

## 2015-05-31 MED ORDER — ONDANSETRON HCL 4 MG/2ML IJ SOLN: 4.0000 mg | Freq: Four times a day (QID) | INTRAMUSCULAR | Status: DC | PRN

## 2015-05-31 MED ORDER — DEXAMETHASONE SODIUM PHOSPHATE 10 MG/ML IJ SOLN
10.0000 mg | Freq: Once | INTRAMUSCULAR | Status: AC
  Administered 2015-05-31: 10 mg via INTRAVENOUS

## 2015-05-31 MED ORDER — FENTANYL CITRATE (PF) 100 MCG/2ML IJ SOLN
INTRAMUSCULAR | Status: AC
  Filled 2015-05-31: qty 4

## 2015-05-31 MED ORDER — FENTANYL CITRATE (PF) 100 MCG/2ML IJ SOLN
INTRAMUSCULAR | Status: DC | PRN
  Administered 2015-05-31: 75 ug via INTRAVENOUS
  Administered 2015-05-31: 25 ug via INTRAVENOUS

## 2015-05-31 MED ORDER — BUPIVACAINE HCL 0.25 % IJ SOLN
INTRAMUSCULAR | Status: DC | PRN
  Administered 2015-05-31: 20 mL

## 2015-05-31 MED ORDER — 0.9 % SODIUM CHLORIDE (POUR BTL) OPTIME
TOPICAL | Status: DC | PRN
  Administered 2015-05-31: 1000 mL

## 2015-05-31 MED ORDER — ACETAMINOPHEN 10 MG/ML IV SOLN
1000.0000 mg | Freq: Once | INTRAVENOUS | Status: AC
Start: 2015-05-31 — End: 2015-05-31
  Administered 2015-05-31: 1000 mg via INTRAVENOUS

## 2015-05-31 MED ORDER — LACTATED RINGERS IV SOLN
INTRAVENOUS | Status: DC
  Administered 2015-05-31: 1000 mL via INTRAVENOUS

## 2015-05-31 MED ORDER — ACETAMINOPHEN 650 MG RE SUPP: 650.0000 mg | Freq: Four times a day (QID) | RECTAL | Status: DC | PRN

## 2015-05-31 MED ORDER — BISACODYL 10 MG RE SUPP: 10.0000 mg | Freq: Every day | RECTAL | Status: DC | PRN

## 2015-05-31 MED ORDER — BUPIVACAINE IN DEXTROSE 0.75-8.25 % IT SOLN
INTRATHECAL | Status: DC | PRN
  Administered 2015-05-31: 2 mL via INTRATHECAL

## 2015-05-31 MED ORDER — PROPOFOL 10 MG/ML IV BOLUS
INTRAVENOUS | Status: DC | PRN
  Administered 2015-05-31: 10 mg via INTRAVENOUS
  Administered 2015-05-31: 20 mg via INTRAVENOUS

## 2015-05-31 MED ORDER — POLYETHYLENE GLYCOL 3350 17 G PO PACK
17.0000 g | PACK | Freq: Every day | ORAL | Status: DC | PRN
  Administered 2015-06-01: 17 g via ORAL
  Filled 2015-05-31: qty 1

## 2015-05-31 MED ORDER — PROPOFOL 10 MG/ML IV BOLUS
INTRAVENOUS | Status: AC
  Filled 2015-05-31: qty 20

## 2015-05-31 MED ORDER — DORZOLAMIDE HCL-TIMOLOL MAL 2-0.5 % OP SOLN
1.0000 [drp] | Freq: Two times a day (BID) | OPHTHALMIC | Status: DC
  Administered 2015-05-31 – 2015-06-01 (×2): 1 [drp] via OPHTHALMIC
  Filled 2015-05-31: qty 10

## 2015-05-31 MED ORDER — BUPIVACAINE LIPOSOME 1.3 % IJ SUSP
INTRAMUSCULAR | Status: DC | PRN
  Administered 2015-05-31: 20 mL

## 2015-05-31 MED ORDER — PANTOPRAZOLE SODIUM 40 MG PO TBEC
40.0000 mg | DELAYED_RELEASE_TABLET | Freq: Every day | ORAL | Status: DC
  Filled 2015-05-31: qty 1

## 2015-05-31 MED ORDER — SODIUM CHLORIDE 0.9 % IR SOLN
Status: DC | PRN
  Administered 2015-05-31: 1000 mL

## 2015-05-31 MED ORDER — METOCLOPRAMIDE HCL 5 MG/ML IJ SOLN: 5.0000 mg | Freq: Three times a day (TID) | INTRAMUSCULAR | Status: DC | PRN

## 2015-05-31 MED ORDER — CEFAZOLIN SODIUM-DEXTROSE 2-3 GM-% IV SOLR
2.0000 g | INTRAVENOUS | Status: AC
  Administered 2015-05-31: 2 g via INTRAVENOUS

## 2015-05-31 MED ORDER — ACETAMINOPHEN 500 MG PO TABS
1000.0000 mg | ORAL_TABLET | Freq: Four times a day (QID) | ORAL | Status: AC
  Administered 2015-05-31 – 2015-06-01 (×4): 1000 mg via ORAL
  Filled 2015-05-31 (×4): qty 2

## 2015-05-31 MED ORDER — PROPOFOL 500 MG/50ML IV EMUL
INTRAVENOUS | Status: DC | PRN
  Administered 2015-05-31: 100 ug/kg/min via INTRAVENOUS

## 2015-05-31 MED ORDER — METHOCARBAMOL 500 MG PO TABS: 500.0000 mg | ORAL_TABLET | Freq: Four times a day (QID) | ORAL | Status: DC | PRN

## 2015-05-31 MED ORDER — TRANEXAMIC ACID 1000 MG/10ML IV SOLN
1000.0000 mg | INTRAVENOUS | Status: AC
  Administered 2015-05-31: 1000 mg via INTRAVENOUS
  Filled 2015-05-31: qty 10

## 2015-05-31 MED ORDER — CHLORHEXIDINE GLUCONATE 4 % EX LIQD: 60.0000 mL | Freq: Once | CUTANEOUS | Status: DC

## 2015-05-31 MED ORDER — BUPIVACAINE LIPOSOME 1.3 % IJ SUSP
20.0000 mL | Freq: Once | INTRAMUSCULAR | Status: DC
  Filled 2015-05-31: qty 20

## 2015-05-31 MED ORDER — PROMETHAZINE HCL 25 MG/ML IJ SOLN: 6.2500 mg | INTRAMUSCULAR | Status: DC | PRN

## 2015-05-31 MED ORDER — SODIUM CHLORIDE 0.9 % IJ SOLN
INTRAMUSCULAR | Status: AC
  Filled 2015-05-31: qty 50

## 2015-05-31 MED ORDER — MENTHOL 3 MG MT LOZG: 1.0000 | LOZENGE | OROMUCOSAL | Status: DC | PRN

## 2015-05-31 MED ORDER — METOCLOPRAMIDE HCL 10 MG PO TABS: 5.0000 mg | ORAL_TABLET | Freq: Three times a day (TID) | ORAL | Status: DC | PRN

## 2015-05-31 MED ORDER — ONDANSETRON HCL 4 MG PO TABS: 4.0000 mg | ORAL_TABLET | Freq: Four times a day (QID) | ORAL | Status: DC | PRN

## 2015-05-31 MED ORDER — DEXAMETHASONE SODIUM PHOSPHATE 10 MG/ML IJ SOLN
10.0000 mg | Freq: Once | INTRAMUSCULAR | Status: AC
  Administered 2015-06-01: 10 mg via INTRAVENOUS
  Filled 2015-05-31: qty 1

## 2015-05-31 MED ORDER — HYDROMORPHONE HCL 1 MG/ML IJ SOLN
INTRAMUSCULAR | Status: AC
  Filled 2015-05-31: qty 1

## 2015-05-31 MED ORDER — ONDANSETRON HCL 4 MG/2ML IJ SOLN
INTRAMUSCULAR | Status: AC
  Filled 2015-05-31: qty 2

## 2015-05-31 MED ORDER — KETOROLAC TROMETHAMINE 15 MG/ML IJ SOLN: 7.5000 mg | Freq: Four times a day (QID) | INTRAMUSCULAR | Status: DC | PRN

## 2015-05-31 MED ORDER — OXYCODONE HCL 5 MG PO TABS: 5.0000 mg | ORAL_TABLET | Freq: Once | ORAL | Status: DC | PRN

## 2015-05-31 MED ORDER — TRAMADOL HCL 50 MG PO TABS: 50.0000 mg | ORAL_TABLET | Freq: Four times a day (QID) | ORAL | Status: DC | PRN

## 2015-05-31 MED ORDER — POTASSIUM CHLORIDE IN NACL 20-0.9 MEQ/L-% IV SOLN
INTRAVENOUS | Status: DC
  Administered 2015-05-31 – 2015-06-01 (×2): via INTRAVENOUS
  Filled 2015-05-31 (×4): qty 1000

## 2015-05-31 MED ORDER — ACETAMINOPHEN 10 MG/ML IV SOLN
INTRAVENOUS | Status: AC
  Filled 2015-05-31: qty 100

## 2015-05-31 SURGICAL SUPPLY — 66 items
BAG DECANTER FOR FLEXI CONT (MISCELLANEOUS) ×3 IMPLANT
BAG SPEC THK2 15X12 ZIP CLS (MISCELLANEOUS) ×1
BAG ZIPLOCK 12X15 (MISCELLANEOUS) ×3 IMPLANT
BANDAGE ELASTIC 6 VELCRO ST LF (GAUZE/BANDAGES/DRESSINGS) ×3 IMPLANT
BANDAGE ESMARK 6X9 LF (GAUZE/BANDAGES/DRESSINGS) ×1 IMPLANT
BLADE SAG 18X100X1.27 (BLADE) ×3 IMPLANT
BLADE SAW SGTL 11.0X1.19X90.0M (BLADE) ×3 IMPLANT
BNDG CMPR 9X6 STRL LF SNTH (GAUZE/BANDAGES/DRESSINGS) ×1
BNDG ESMARK 6X9 LF (GAUZE/BANDAGES/DRESSINGS) ×3
BOWL SMART MIX CTS (DISPOSABLE) ×3 IMPLANT
CAPT KNEE TOTAL 3 ATTUNE ×2 IMPLANT
CEMENT HV SMART SET (Cement) ×6 IMPLANT
CLOSURE WOUND 1/2 X4 (GAUZE/BANDAGES/DRESSINGS) ×1
CUFF TOURN SGL QUICK 34 (TOURNIQUET CUFF) ×3
CUFF TRNQT CYL 34X4X40X1 (TOURNIQUET CUFF) ×1 IMPLANT
DECANTER SPIKE VIAL GLASS SM (MISCELLANEOUS) ×3 IMPLANT
DRAPE EXTREMITY T 121X128X90 (DRAPE) ×3 IMPLANT
DRAPE POUCH INSTRU U-SHP 10X18 (DRAPES) ×3 IMPLANT
DRAPE U-SHAPE 47X51 STRL (DRAPES) ×3 IMPLANT
DRSG ADAPTIC 3X8 NADH LF (GAUZE/BANDAGES/DRESSINGS) ×3 IMPLANT
DRSG PAD ABDOMINAL 8X10 ST (GAUZE/BANDAGES/DRESSINGS) ×3 IMPLANT
DURAPREP 26ML APPLICATOR (WOUND CARE) ×3 IMPLANT
ELECT REM PT RETURN 9FT ADLT (ELECTROSURGICAL) ×3
ELECTRODE REM PT RTRN 9FT ADLT (ELECTROSURGICAL) ×1 IMPLANT
EVACUATOR 1/8 PVC DRAIN (DRAIN) ×3 IMPLANT
FACESHIELD WRAPAROUND (MASK) ×15 IMPLANT
FACESHIELD WRAPAROUND OR TEAM (MASK) ×5 IMPLANT
GAUZE SPONGE 4X4 12PLY STRL (GAUZE/BANDAGES/DRESSINGS) ×3 IMPLANT
GLOVE BIO SURGEON STRL SZ7.5 (GLOVE) IMPLANT
GLOVE BIO SURGEON STRL SZ8 (GLOVE) ×3 IMPLANT
GLOVE BIOGEL PI IND STRL 6.5 (GLOVE) IMPLANT
GLOVE BIOGEL PI IND STRL 8 (GLOVE) ×1 IMPLANT
GLOVE BIOGEL PI INDICATOR 6.5 (GLOVE) ×2
GLOVE BIOGEL PI INDICATOR 8 (GLOVE) ×2
GLOVE SURG SS PI 6.5 STRL IVOR (GLOVE) IMPLANT
GOWN STRL REUS W/TWL LRG LVL3 (GOWN DISPOSABLE) ×3 IMPLANT
GOWN STRL REUS W/TWL XL LVL3 (GOWN DISPOSABLE) ×6 IMPLANT
HANDPIECE INTERPULSE COAX TIP (DISPOSABLE) ×3
IMMOBILIZER KNEE 20 (SOFTGOODS) ×5 IMPLANT
IMMOBILIZER KNEE 20 THIGH 36 (SOFTGOODS) ×1 IMPLANT
KIT BASIN OR (CUSTOM PROCEDURE TRAY) ×3 IMPLANT
MANIFOLD NEPTUNE II (INSTRUMENTS) ×3 IMPLANT
NDL SAFETY ECLIPSE 18X1.5 (NEEDLE) ×2 IMPLANT
NEEDLE HYPO 18GX1.5 SHARP (NEEDLE) ×6
NS IRRIG 1000ML POUR BTL (IV SOLUTION) ×3 IMPLANT
PACK TOTAL JOINT (CUSTOM PROCEDURE TRAY) ×3 IMPLANT
PAD ABD 8X10 STRL (GAUZE/BANDAGES/DRESSINGS) ×2 IMPLANT
PADDING CAST COTTON 6X4 STRL (CAST SUPPLIES) ×5 IMPLANT
PEN SKIN MARKING BROAD (MISCELLANEOUS) ×3 IMPLANT
POSITIONER SURGICAL ARM (MISCELLANEOUS) ×3 IMPLANT
SET HNDPC FAN SPRY TIP SCT (DISPOSABLE) ×1 IMPLANT
STRIP CLOSURE SKIN 1/2X4 (GAUZE/BANDAGES/DRESSINGS) ×3 IMPLANT
SUCTION FRAZIER 12FR DISP (SUCTIONS) ×3 IMPLANT
SUT MNCRL AB 4-0 PS2 18 (SUTURE) ×3 IMPLANT
SUT VIC AB 2-0 CT1 27 (SUTURE) ×9
SUT VIC AB 2-0 CT1 TAPERPNT 27 (SUTURE) ×3 IMPLANT
SUT VLOC 180 0 24IN GS25 (SUTURE) ×3 IMPLANT
SYR 20CC LL (SYRINGE) ×3 IMPLANT
SYR 50ML LL SCALE MARK (SYRINGE) ×1 IMPLANT
TOWEL OR 17X26 10 PK STRL BLUE (TOWEL DISPOSABLE) ×3 IMPLANT
TOWEL OR NON WOVEN STRL DISP B (DISPOSABLE) ×2 IMPLANT
TRAY FOLEY W/METER SILVER 14FR (SET/KITS/TRAYS/PACK) ×1 IMPLANT
TRAY FOLEY W/METER SILVER 16FR (SET/KITS/TRAYS/PACK) ×3 IMPLANT
WATER STERILE IRR 1500ML POUR (IV SOLUTION) ×3 IMPLANT
WRAP KNEE MAXI GEL POST OP (GAUZE/BANDAGES/DRESSINGS) ×3 IMPLANT
YANKAUER SUCT BULB TIP 10FT TU (MISCELLANEOUS) ×3 IMPLANT

## 2015-05-31 NOTE — Progress Notes (Signed)
Utilization review completed.  

## 2015-05-31 NOTE — Addendum Note (Signed)
Addendum  created 05/31/15 1337 by Lissa Morales, CRNA   Modules edited: Anesthesia Blocks and Procedures, Clinical Notes   Clinical Notes:  File: [id number]

## 2015-05-31 NOTE — Progress Notes (Signed)
Dr. Jillyn Hidden, anesthesiologist, notified of pt's heart rate 40's; in to check pt, BP stable, pt alert and oriented; will continue to observe, no new orders

## 2015-05-31 NOTE — Anesthesia Preprocedure Evaluation (Addendum)
Anesthesia Evaluation  Patient identified by MRN, date of birth, ID band Patient awake    Reviewed: Allergy & Precautions, H&P , NPO status , Patient's Chart, lab work & pertinent test results  History of Anesthesia Complications Negative for: history of anesthetic complications  Airway Mallampati: II  TM Distance: >3 FB Neck ROM: full    Dental no notable dental hx.    Pulmonary COPD, former smoker,    Pulmonary exam normal breath sounds clear to auscultation       Cardiovascular negative cardio ROS Normal cardiovascular exam Rhythm:regular Rate:Normal     Neuro/Psych negative neurological ROS     GI/Hepatic Neg liver ROS, GERD  Medicated,  Endo/Other  negative endocrine ROS  Renal/GU negative Renal ROS     Musculoskeletal  (+) Arthritis ,   Abdominal   Peds  Hematology negative hematology ROS (+)   Anesthesia Other Findings   Reproductive/Obstetrics negative OB ROS                            Anesthesia Physical Anesthesia Plan  ASA: II  Anesthesia Plan: Spinal   Post-op Pain Management:    Induction:   Airway Management Planned: Simple Face Mask  Additional Equipment:   Intra-op Plan:   Post-operative Plan:   Informed Consent: I have reviewed the patients History and Physical, chart, labs and discussed the procedure including the risks, benefits and alternatives for the proposed anesthesia with the patient or authorized representative who has indicated his/her understanding and acceptance.   Dental Advisory Given  Plan Discussed with: Anesthesiologist, CRNA and Surgeon  Anesthesia Plan Comments: (No blood thinning meds, no cardiac history, labs normal)        Anesthesia Quick Evaluation

## 2015-05-31 NOTE — Transfer of Care (Signed)
Immediate Anesthesia Transfer of Care Note  Patient: Darren Rivers  Procedure(s) Performed: Procedure(s): LEFT TOTAL KNEE ARTHROPLASTY (Left)  Patient Location: PACU  Anesthesia Type:Spinal  Level of Consciousness: awake, alert , oriented and patient cooperative  Airway & Oxygen Therapy: Patient Spontanous Breathing and Patient connected to face mask oxygen  Post-op Assessment: Report given to RN and Post -op Vital signs reviewed and stable  Post vital signs: stable  Last Vitals:  Filed Vitals:   05/31/15 0635  BP: 112/71  Pulse: 51  Temp: 36.6 C  Resp: 18    Complications: No apparent anesthesia complications  L4 spinal level

## 2015-05-31 NOTE — Anesthesia Postprocedure Evaluation (Signed)
  Anesthesia Post-op Note  Patient: Darren Rivers  Procedure(s) Performed: Procedure(s) (LRB): LEFT TOTAL KNEE ARTHROPLASTY (Left)  Patient Location: PACU  Anesthesia Type: Spinal  Level of Consciousness: awake and alert   Airway and Oxygen Therapy: Patient Spontanous Breathing  Post-op Pain: mild  Post-op Assessment: Post-op Vital signs reviewed, Patient's Cardiovascular Status Stable, Respiratory Function Stable, Patent Airway and No signs of Nausea or vomiting  Last Vitals:  Filed Vitals:   05/31/15 1030  BP:   Pulse:   Temp: 35 C  Resp:     Post-op Vital Signs: stable   Complications: No apparent anesthesia complications

## 2015-05-31 NOTE — Evaluation (Signed)
Physical Therapy Evaluation Patient Details Name: Darren Rivers MRN: [id number] DOB: 04/10/1951 Today's Date: 05/31/2015   History of Present Illness  64 yo male s/p L TKA 05/31/15  Clinical Impression  On eval, pt required Min assist for mobility-able to ambulate ~50 feet with RW. Pain rated 7-8/10    Follow Up Recommendations Home health PT    Equipment Recommendations  Rolling walker with 5" wheels    Recommendations for Other Services       Precautions / Restrictions Precautions Precautions: Knee Required Braces or Orthoses: Knee Immobilizer - Left Knee Immobilizer - Left: Discontinue once straight leg raise with < 10 degree lag Restrictions Weight Bearing Restrictions: No LLE Weight Bearing: Weight bearing as tolerated      Mobility  Bed Mobility Overal bed mobility: Needs Assistance Bed Mobility: Supine to Sit;Sit to Supine     Supine to sit: Min assist Sit to supine: Min assist   General bed mobility comments: assist for L LE  Transfers Overall transfer level: Needs assistance Equipment used: Rolling walker (2 wheeled) Transfers: Sit to/from Stand Sit to Stand: Min assist         General transfer comment: assist to rise, stabilize, control descent. vcs safety, hand placement  Ambulation/Gait Ambulation/Gait assistance: Min guard Ambulation Distance (Feet): 50 Feet Assistive device: Rolling walker (2 wheeled) Gait Pattern/deviations: Step-to pattern;Antalgic     General Gait Details: VCs safety, technique, sequence.   Stairs            Wheelchair Mobility    Modified Rankin (Stroke Patients Only)       Balance                                             Pertinent Vitals/Pain Pain Assessment: 0-10 Pain Score: 7  Pain Location: L knee with activity Pain Descriptors / Indicators: Aching;Sore Pain Intervention(s): Ice applied;Monitored during session;Repositioned    Home Living Family/patient expects to be  discharged to:: Private residence Living Arrangements: Spouse/significant other Available Help at Discharge: Family Type of Home: House Home Access: Stairs to enter Entrance Stairs-Rails: Right Entrance Stairs-Number of Steps: 2 Home Layout: Two level;Able to live on main level with bedroom/bathroom Home Equipment: Cane - single point      Prior Function Level of Independence: Independent               Hand Dominance        Extremity/Trunk Assessment               Lower Extremity Assessment: LLE deficits/detail   LLE Deficits / Details: SLR 3/5. moves ankle well  Cervical / Trunk Assessment: Normal  Communication   Communication: No difficulties  Cognition Arousal/Alertness: Awake/alert Behavior During Therapy: WFL for tasks assessed/performed Overall Cognitive Status: Within Functional Limits for tasks assessed                      General Comments      Exercises        Assessment/Plan    PT Assessment Patient needs continued PT services  PT Diagnosis Difficulty walking;Acute pain   PT Problem List Decreased strength;Decreased range of motion;Decreased activity tolerance;Decreased balance;Decreased mobility;Pain;Decreased knowledge of use of DME  PT Treatment Interventions DME instruction;Gait training;Stair training;Functional mobility training;Therapeutic activities;Patient/family education;Balance training;Therapeutic exercise   PT Goals (Current goals can be found in the Care Plan  section) Acute Rehab PT Goals Patient Stated Goal: regain independence PT Goal Formulation: With patient Time For Goal Achievement: 06/07/15 Potential to Achieve Goals: Good    Frequency 7X/week   Barriers to discharge        Co-evaluation               End of Session Equipment Utilized During Treatment: Gait belt Activity Tolerance: Patient tolerated treatment well Patient left: in bed;with call bell/phone within reach;with family/visitor  present           Time: 0156-1537 PT Time Calculation (min) (ACUTE ONLY): 16 min   Charges:   PT Evaluation $Initial PT Evaluation Tier I: 1 Procedure     PT G Codes:        Darren Rivers, MPT Pager: [phone]

## 2015-05-31 NOTE — Interval H&P Note (Signed)
History and Physical Interval Note:  05/31/2015 6:50 AM  Darren Rivers  has presented today for surgery, with the diagnosis of OA LEFT KNEE  The various methods of treatment have been discussed with the patient and family. After consideration of risks, benefits and other options for treatment, the patient has consented to  Procedure(s): LEFT TOTAL KNEE ARTHROPLASTY (Left) as a surgical intervention .  The patient's history has been reviewed, patient examined, no change in status, stable for surgery.  I have reviewed the patient's chart and labs.  Questions were answered to the patient's satisfaction.     Gearlean Alf

## 2015-05-31 NOTE — Op Note (Signed)
Pre-operative diagnosis- Osteoarthritis  Left knee(s)  Post-operative diagnosis- Osteoarthritis Left knee(s)  Procedure-  Left  Total Knee Arthroplasty  Surgeon- Dione Plover. Aluisio, MD  Assistant- Molli Barrows, PA-C   Anesthesia-  Spinal  EBL-* No blood loss amount entered *   Drains Hemovac  Tourniquet time-  Total Tourniquet Time Documented: Thigh (Left) - 40 minutes Total: Thigh (Left) - 40 minutes     Complications- None  Condition-PACU - hemodynamically stable.   Brief Clinical Note  Darren Rivers is a 64 y.o. year old male with end stage OA of her left knee with progressively worsening pain and dysfunction. She has constant pain, with activity and at rest and significant functional deficits with difficulties even with ADLs. She has had extensive non-op management including analgesics, injections of cortisone and viscosupplements, and home exercise program, but remains in significant pain with significant dysfunction. Radiographs show bone on bone arthritis medial and patellofemoral. She presents now for left Total Knee Arthroplasty.    Procedure in detail---   The patient is brought into the operating room and positioned supine on the operating table. After successful administration of  Spinal,   a tourniquet is placed high on the  Left thigh(s) and the lower extremity is prepped and draped in the usual sterile fashion. Time out is performed by the operating team and then the  Left lower extremity is wrapped in Esmarch, knee flexed and the tourniquet inflated to 300 mmHg.       A midline incision is made with a ten blade through the subcutaneous tissue to the level of the extensor mechanism. A fresh blade is used to make a medial parapatellar arthrotomy. Soft tissue over the proximal medial tibia is subperiosteally elevated to the joint line with a knife and into the semimembranosus bursa with a Cobb elevator. Soft tissue over the proximal lateral tibia is elevated with  attention being paid to avoiding the patellar tendon on the tibial tubercle. The patella is everted, knee flexed 90 degrees and the ACL and PCL are removed. Findings are bone on bone all 3 compartments with massive global osteophytes.        The drill is used to create a starting hole in the distal femur and the canal is thoroughly irrigated with sterile saline to remove the fatty contents. The 5 degree Left  valgus alignment guide is placed into the femoral canal and the distal femoral cutting block is pinned to remove 10 mm off the distal femur. Resection is made with an oscillating saw.      The tibia is subluxed forward and the menisci are removed. The extramedullary alignment guide is placed referencing proximally at the medial aspect of the tibial tubercle and distally along the second metatarsal axis and tibial crest. The block is pinned to remove 22mm off the more deficient medial  side. Resection is made with an oscillating saw. Size 7is the most appropriate size for the tibia and the proximal tibia is prepared with the modular drill and keel punch for that size.      The femoral sizing guide is placed and size 7 is most appropriate. Rotation is marked off the epicondylar axis and confirmed by creating a rectangular flexion gap at 90 degrees. The size 7 cutting block is pinned in this rotation and the anterior, posterior and chamfer cuts are made with the oscillating saw. The intercondylar block is then placed and that cut is made.      Trial size 7 tibial component,  trial size 7 posterior stabilized femur and a 7  mm posterior stabilized rotating platform insert trial is placed. Full extension is achieved with excellent varus/valgus and anterior/posterior balance throughout full range of motion. The patella is everted and thickness measured to be 27  mm. Free hand resection is taken to 15 mm, a 41 template is placed, lug holes are drilled, trial patella is placed, and it tracks normally. Osteophytes are  removed off the posterior femur with the trial in place. All trials are removed and the cut bone surfaces prepared with pulsatile lavage. Cement is mixed and once ready for implantation, the size 7 tibial implant, size  7 posterior stabilized femoral component, and the size 41 patella are cemented in place and the patella is held with the clamp. The trial insert is placed and the knee held in full extension. The Exparel (20 ml mixed with 30 ml saline) and .25% Bupivicaine, are injected into the extensor mechanism, posterior capsule, medial and lateral gutters and subcutaneous tissues.  All extruded cement is removed and once the cement is hard the permanent 7 mm posterior stabilized rotating platform insert is placed into the tibial tray.      The wound is copiously irrigated with saline solution and the extensor mechanism closed over a hemovac drain with #1 V-loc suture. The tourniquet is released for a total tourniquet time of 40  minutes. Flexion against gravity is 135 degrees and the patella tracks normally. Subcutaneous tissue is closed with 2.0 vicryl and subcuticular with running 4.0 Monocryl. The incision is cleaned and dried and steri-strips and a bulky sterile dressing are applied. The limb is placed into a knee immobilizer and the patient is awakened and transported to recovery in stable condition.      Please note that a surgical assistant was a medical necessity for this procedure in order to perform it in a safe and expeditious manner. Surgical assistant was necessary to retract the ligaments and vital neurovascular structures to prevent injury to them and also necessary for proper positioning of the limb to allow for anatomic placement of the prosthesis.   Dione Plover Aluisio, MD    05/31/2015, 9:14 AM

## 2015-05-31 NOTE — Anesthesia Procedure Notes (Addendum)
Spinal Patient location during procedure: OR Staffing Resident/CRNA: EVANS, JANET E Performed by: resident/CRNA  Preanesthetic Checklist Completed: patient identified, site marked, surgical consent, pre-op evaluation, timeout performed, IV checked, risks and benefits discussed and monitors and equipment checked Spinal Block Patient position: sitting Prep: Betadine Patient monitoring: heart rate, continuous pulse ox and blood pressure Location: L4-5 Injection technique: single-shot Needle Needle type: Sprotte  Needle gauge: 24 G Needle length: 9 cm Additional Notes Expiration date of kit checked and confirmed. Patient tolerated procedure well, without complications.    Spinal

## 2015-06-01 LAB — CBC
HEMATOCRIT: 29.7 % — AB (ref 39.0–52.0)
Hemoglobin: 10.3 g/dL — ABNORMAL LOW (ref 13.0–17.0)
MCH: 33.7 pg (ref 26.0–34.0)
MCHC: 34.7 g/dL (ref 30.0–36.0)
MCV: 97.1 fL (ref 78.0–100.0)
PLATELETS: 209 10*3/uL (ref 150–400)
RBC: 3.06 MIL/uL — AB (ref 4.22–5.81)
RDW: 13.2 % (ref 11.5–15.5)
WBC: 16.5 10*3/uL — AB (ref 4.0–10.5)

## 2015-06-01 LAB — BASIC METABOLIC PANEL
ANION GAP: 7 (ref 5–15)
BUN: 12 mg/dL (ref 6–20)
CALCIUM: 8.3 mg/dL — AB (ref 8.9–10.3)
CO2: 22 mmol/L (ref 22–32)
Chloride: 107 mmol/L (ref 101–111)
Creatinine, Ser: 0.87 mg/dL (ref 0.61–1.24)
GLUCOSE: 158 mg/dL — AB (ref 65–99)
POTASSIUM: 4.5 mmol/L (ref 3.5–5.1)
Sodium: 136 mmol/L (ref 135–145)

## 2015-06-01 MED ORDER — OMEPRAZOLE 20 MG PO CPDR
20.0000 mg | DELAYED_RELEASE_CAPSULE | Freq: Every day | ORAL | Status: DC
  Administered 2015-06-01: 20 mg via ORAL
  Filled 2015-06-01: qty 1

## 2015-06-01 MED ORDER — METHOCARBAMOL 500 MG PO TABS: 500.0000 mg | ORAL_TABLET | Freq: Four times a day (QID) | ORAL | Status: DC | PRN

## 2015-06-01 MED ORDER — OXYCODONE HCL 5 MG PO TABS: 5.0000 mg | ORAL_TABLET | ORAL | Status: DC | PRN

## 2015-06-01 MED ORDER — RIVAROXABAN 10 MG PO TABS: 10.0000 mg | ORAL_TABLET | Freq: Every day | ORAL | Status: DC

## 2015-06-01 MED ORDER — TRAMADOL HCL 50 MG PO TABS: 50.0000 mg | ORAL_TABLET | Freq: Four times a day (QID) | ORAL | Status: DC | PRN

## 2015-06-01 NOTE — Discharge Instructions (Signed)
° °Dr. Frank Aluisio °Total Joint Specialist °[facility] °[address] °[place], [place] [address] °[phone] ° °TOTAL KNEE REPLACEMENT POSTOPERATIVE DIRECTIONS ° °Knee Rehabilitation, Guidelines Following Surgery  °Results after knee surgery are often greatly improved when you follow the exercise, range of motion and muscle strengthening exercises prescribed by your doctor. Safety measures are also important to protect the knee from further injury. Any time any of these exercises cause you to have increased pain or swelling in your knee joint, decrease the amount until you are comfortable again and slowly increase them. If you have problems or questions, call your caregiver or physical therapist for advice.  ° °HOME CARE INSTRUCTIONS  °Remove items at home which could result in a fall. This includes throw rugs or furniture in walking pathways.  °· ICE to the affected knee every three hours for 30 minutes at a time and then as needed for pain and swelling.  Continue to use ice on the knee for pain and swelling from surgery. You may notice swelling that will progress down to the foot and ankle.  This is normal after surgery.  Elevate the leg when you are not up walking on it.   °· Continue to use the breathing machine which will help keep your temperature down.  It is common for your temperature to cycle up and down following surgery, especially at night when you are not up moving around and exerting yourself.  The breathing machine keeps your lungs expanded and your temperature down. °· Do not place pillow under knee, focus on keeping the knee straight while resting ° °DIET °You may resume your previous home diet once your are discharged from the hospital. ° °DRESSING / WOUND CARE / SHOWERING °You may shower 3 days after surgery, but keep the wounds dry during showering.  You may use an occlusive plastic wrap (Press'n Seal for example), NO SOAKING/SUBMERGING IN THE BATHTUB.  If the  bandage gets wet, change with a clean dry gauze.  If the incision gets wet, pat the wound dry with a clean towel. °You may start showering once you are discharged home but do not submerge the incision under water. Just pat the incision dry and apply a dry gauze dressing on daily. °Change the surgical dressing daily and reapply a dry dressing each time. ° °ACTIVITY °Walk with your walker as instructed. °Use walker as long as suggested by your caregivers. °Avoid periods of inactivity such as sitting longer than an hour when not asleep. This helps prevent blood clots.  °You may resume a sexual relationship in one month or when given the OK by your doctor.  °You may return to work once you are cleared by your doctor.  °Do not drive a car for 6 weeks or until released by you surgeon.  °Do not drive while taking narcotics. ° °WEIGHT BEARING °Weight bearing as tolerated with assist device (walker, cane, etc) as directed, use it as long as suggested by your surgeon or therapist, typically at least 4-6 weeks. ° °POSTOPERATIVE CONSTIPATION PROTOCOL °Constipation - defined medically as fewer than three stools per week and severe constipation as less than one stool per week. ° °One of the most common issues patients have following surgery is constipation.  Even if you have a regular bowel pattern at home, your normal regimen is likely to be disrupted due to multiple reasons following surgery.  Combination of anesthesia, postoperative narcotics, change in appetite and fluid intake all can affect your bowels.    In order to avoid complications following surgery, here are some recommendations in order to help you during your recovery period. ° °Colace (docusate) - Pick up an over-the-counter form of Colace or another stool softener and take twice a day as long as you are requiring postoperative pain medications.  Take with a full glass of water daily.  If you experience loose stools or diarrhea, hold the colace until you stool forms  back up.  If your symptoms do not get better within 1 week or if they get worse, check with your doctor. ° °Dulcolax (bisacodyl) - Pick up over-the-counter and take as directed by the product packaging as needed to assist with the movement of your bowels.  Take with a full glass of water.  Use this product as needed if not relieved by Colace only.  ° °MiraLax (polyethylene glycol) - Pick up over-the-counter to have on hand.  MiraLax is a solution that will increase the amount of water in your bowels to assist with bowel movements.  Take as directed and can mix with a glass of water, juice, soda, coffee, or tea.  Take if you go more than two days without a movement. °Do not use MiraLax more than once per day. Call your doctor if you are still constipated or irregular after using this medication for 7 days in a row. ° °If you continue to have problems with postoperative constipation, please contact the office for further assistance and recommendations.  If you experience "the worst abdominal pain ever" or develop nausea or vomiting, please contact the office immediatly for further recommendations for treatment. ° °ITCHING ° If you experience itching with your medications, try taking only a single pain pill, or even half a pain pill at a time.  You can also use Benadryl over the counter for itching or also to help with sleep.  ° °TED HOSE STOCKINGS °Wear the elastic stockings on both legs for three weeks following surgery during the day but you may remove then at night for sleeping. ° °MEDICATIONS °See your medication summary on the “After Visit Summary” that the nursing staff will review with you prior to discharge.  You may have some home medications which will be placed on hold until you complete the course of blood thinner medication.  It is important for you to complete the blood thinner medication as prescribed by your surgeon.  Continue your approved medications as instructed at time of  discharge. ° °PRECAUTIONS °If you experience chest pain or shortness of breath - call 911 immediately for transfer to the hospital emergency department.  °If you develop a fever greater that 101 F, purulent drainage from wound, increased redness or drainage from wound, foul odor from the wound/dressing, or calf pain - CONTACT YOUR SURGEON.   °                                                °FOLLOW-UP APPOINTMENTS °Make sure you keep all of your appointments after your operation with your surgeon and caregivers. You should call the office at the above phone number and make an appointment for approximately two weeks after the date of your surgery or on the date instructed by your surgeon outlined in the "After Visit Summary". ° ° °RANGE OF MOTION AND STRENGTHENING EXERCISES  °Rehabilitation of the knee is important following a knee injury or   an operation. After just a few days of immobilization, the muscles of the thigh which control the knee become weakened and shrink (atrophy). Knee exercises are designed to build up the tone and strength of the thigh muscles and to improve knee motion. Often times heat used for twenty to thirty minutes before working out will loosen up your tissues and help with improving the range of motion but do not use heat for the first two weeks following surgery. These exercises can be done on a training (exercise) mat, on the floor, on a table or on a bed. Use what ever works the best and is most comfortable for you Knee exercises include:  Leg Lifts - While your knee is still immobilized in a splint or cast, you can do straight leg raises. Lift the leg to 60 degrees, hold for 3 sec, and slowly lower the leg. Repeat 10-20 times 2-3 times daily. Perform this exercise against resistance later as your knee gets better.  Quad and Hamstring Sets - Tighten up the muscle on the front of the thigh (Quad) and hold for 5-10 sec. Repeat this 10-20 times hourly. Hamstring sets are done by pushing the  foot backward against an object and holding for 5-10 sec. Repeat as with quad sets.   Leg Slides: Lying on your back, slowly slide your foot toward your buttocks, bending your knee up off the floor (only go as far as is comfortable). Then slowly slide your foot back down until your leg is flat on the floor again.  Angel Wings: Lying on your back spread your legs to the side as far apart as you can without causing discomfort.  A rehabilitation program following serious knee injuries can speed recovery and prevent re-injury in the future due to weakened muscles. Contact your doctor or a physical therapist for more information on knee rehabilitation.   IF YOU ARE TRANSFERRED TO A SKILLED REHAB FACILITY If the patient is transferred to a skilled rehab facility following release from the hospital, a list of the current medications will be sent to the facility for the patient to continue.  When discharged from the skilled rehab facility, please have the facility set up the patient's [place] prior to being released. Also, the skilled facility will be responsible for providing the patient with their medications at time of release from the facility to include their pain medication, the muscle relaxants, and their blood thinner medication. If the patient is still at the rehab facility at time of the two week follow up appointment, the skilled rehab facility will also need to assist the patient in arranging follow up appointment in our office and any transportation needs.  MAKE SURE YOU:  Understand these instructions.  Get help right away if you are not doing well or get worse.    Pick up stool softner and laxative for home use following surgery while on pain medications. Do not submerge incision under water. May remove the surgical dressing tomorrow, Wednesday 06/02/2015, and then apply a dry gauze dressing to the incision daily. Please use good hand washing techniques while changing  dressing each day. May shower starting three days after surgery on Thursday 06/03/2015. Please use a clean towel to pat the incision dry following showers. Continue to use ice for pain and swelling after surgery. Do not use any lotions or creams on the incision until instructed by your surgeon.  Take Xarelto for a total of three weeks, then discontinue Xarelto. Once the patient  has completed the Xarelto, they may resume the 81 mg Aspirin.   Information on my medicine - XARELTO (Rivaroxaban)  This medication education was reviewed with me or my healthcare representative as part of my discharge preparation.  The pharmacist that spoke with me during my hospital stay was:  Lolita Patella, [facility]  Why was Xarelto prescribed for you? Xarelto was prescribed for you to reduce the risk of blood clots forming after orthopedic surgery. The medical term for these abnormal blood clots is venous thromboembolism (VTE).  What do you need to know about xarelto ? Take your Xarelto ONCE DAILY at the same time every day. You may take it either with or without food.  If you have difficulty swallowing the tablet whole, you may crush it and mix in applesauce just prior to taking your dose.  Take Xarelto exactly as prescribed by your doctor and DO NOT stop taking Xarelto without talking to the doctor who prescribed the medication.  Stopping without other VTE prevention medication to take the place of Xarelto may increase your risk of developing a clot.  After discharge, you should have regular check-up appointments with your healthcare provider that is prescribing your Xarelto.    What do you do if you miss a dose? If you miss a dose, take it as soon as you remember on the same day then continue your regularly scheduled once daily regimen the next day. Do not take two doses of Xarelto on the same day.   Important Safety Information A possible side effect of Xarelto is bleeding. You should  call your healthcare provider right away if you experience any of the following: ? Bleeding from an injury or your nose that does not stop. ? Unusual colored urine (red or dark brown) or unusual colored stools (red or black). ? Unusual bruising for unknown reasons. ? A serious fall or if you hit your head (even if there is no bleeding).  Some medicines may interact with Xarelto and might increase your risk of bleeding while on Xarelto. To help avoid this, consult your healthcare provider or pharmacist prior to using any new prescription or non-prescription medications, including herbals, vitamins, non-steroidal anti-inflammatory drugs (NSAIDs) and supplements.  This website has more information on Xarelto: [email or web address]

## 2015-06-01 NOTE — Progress Notes (Signed)
Physical Therapy Treatment Patient Details Name: Darren Rivers MRN: [id number] DOB: 06-03-1951 Today's Date: 06/01/2015    History of Present Illness 64 y.o. male s/p L TKA 05/31/15    PT Comments    Progressing very well with mobility. Pain rated 6-7/10 with activity. Will have a 2nd session to continue stair training.   Follow Up Recommendations  Home health PT     Equipment Recommendations  Rolling walker with 5" wheels    Recommendations for Other Services       Precautions / Restrictions Precautions Precautions: Knee Required Braces or Orthoses: Knee Immobilizer - Left Knee Immobilizer - Left: Discontinue once straight leg raise with < 10 degree lag Restrictions Weight Bearing Restrictions: No LLE Weight Bearing: Weight bearing as tolerated    Mobility  Bed Mobility Overal bed mobility: Needs Assistance Bed Mobility: Supine to Sit;Sit to Supine     Supine to sit: Min guard Sit to supine: Min guard   General bed mobility comments: close guard for safety  Transfers Overall transfer level: Needs assistance Equipment used: Rolling walker (2 wheeled) Transfers: Sit to/from Stand Sit to Stand: Min guard         General transfer comment: close guard for safety  Ambulation/Gait Ambulation/Gait assistance: Min guard Ambulation Distance (Feet): 200 Feet Assistive device: Rolling walker (2 wheeled) Gait Pattern/deviations: Step-through pattern;Antalgic;Trunk flexed     General Gait Details: close guard for safety   Stairs Stairs: Yes Stairs assistance: Min guard Stair Management: Forwards;Step to pattern;One rail Left Number of Stairs: 2 General stair comments: VCs safety, technique, sequence. close guard for safety  Wheelchair Mobility    Modified Rankin (Stroke Patients Only)       Balance                                    Cognition Arousal/Alertness: Awake/alert Behavior During Therapy: WFL for tasks  assessed/performed Overall Cognitive Status: Within Functional Limits for tasks assessed                      Exercises Other Exercises Other Exercises: ankle pumps and able to do left straight leg raise quickly    General Comments        Pertinent Vitals/Pain Pain Assessment: 0-10 Pain Score: 6  Pain Location: L knee Pain Descriptors / Indicators: Aching;Sore Pain Intervention(s): Monitored during session;Ice applied;Repositioned    Home Living Family/patient expects to be discharged to:: Private residence Living Arrangements: Spouse/significant other Available Help at Discharge: Family;Available 24 hours/day Type of Home: House Home Access: Stairs to enter Entrance Stairs-Rails: Right Home Layout: Two level;Able to live on main level with bedroom/bathroom Home Equipment: Cane - single point (access to shower chair and 3 in 1)      Prior Function Level of Independence: Independent          PT Goals (current goals can now be found in the care plan section) Progress towards PT goals: Progressing toward goals    Frequency  7X/week    PT Plan Current plan remains appropriate    Co-evaluation             End of Session Equipment Utilized During Treatment: Gait belt Activity Tolerance: Patient tolerated treatment well Patient left: in bed;with call bell/phone within reach;with family/visitor present     Time: 5427-0623 PT Time Calculation (min) (ACUTE ONLY): 19 min  Charges:  $Gait Training: 8-22 mins  G Codes:      Weston Anna, MPT Pager: [phone]

## 2015-06-01 NOTE — Evaluation (Signed)
Occupational Therapy Evaluation Patient Details Name: Darren Rivers MRN: [id number] DOB: Mar 16, 1951 Today's Date: 06/01/2015    History of Present Illness 64 y.o. male s/p L TKA 05/31/15   Clinical Impression   Pt s/p above. Education provided during session and OT signing off. Wife available to assist at home as needed.    Follow Up Recommendations  No OT follow up;Supervision - Intermittent    Equipment Recommendations  None recommended by OT    Recommendations for Other Services       Precautions / Restrictions Precautions Precautions: Knee Required Braces or Orthoses: Knee Immobilizer - Left Restrictions Weight Bearing Restrictions: Yes LLE Weight Bearing: Weight bearing as tolerated      Mobility Bed Mobility Overal bed mobility: Modified Independent (HOB elevated)                Transfers Overall transfer level: Needs assistance Equipment used: Rolling walker (2 wheeled) Transfers: Sit to/from Stand Sit to Stand: Supervision         General transfer comment: cues for hand placement    Balance      Used RW for ambulation, however stepped over simulated tub with Min guard assist without RW.                                      ADL Overall ADL's : Needs assistance/impaired                 Upper Body Dressing : Set up;Supervision/safety;Standing   Lower Body Dressing: Set up;Supervision/safety;Sit to/from stand   Toilet Transfer: Supervision/safety;Ambulation;RW (sit to stand from bed)       Tub/ Shower Transfer: Min guard;Ambulation;Walk-in shower (used RW to ambulate to simulated shower;stepped over without)   Functional mobility during ADLs: Supervision/safety;Min guard;Rolling walker (stepped over simulated shower without RW) General ADL Comments: Educated on LB dressing technique. Educated on safety such as use of bag on walker, safe footwear, rugs/items on floor, and sitting for LB ADLs. Recommended someone be  with him for shower transfer. Educated on shower transfer techniques.     Vision     Perception     Praxis      Pertinent Vitals/Pain Pain Assessment: 0-10 Pain Score: 2  Pain Location: left knee Pain Descriptors / Indicators: Aching Pain Intervention(s): Monitored during session     Hand Dominance     Extremity/Trunk Assessment Upper Extremity Assessment Upper Extremity Assessment: Overall WFL for tasks assessed   Lower Extremity Assessment Lower Extremity Assessment: Defer to PT evaluation       Communication Communication Communication: No difficulties   Cognition Arousal/Alertness: Awake/alert Behavior During Therapy: WFL for tasks assessed/performed Overall Cognitive Status: Within Functional Limits for tasks assessed                     General Comments       Exercises Exercises: Other exercises Other Exercises Other Exercises: ankle pumps  performed left straight legs raises (not slowly)   Shoulder Instructions      Home Living Family/patient expects to be discharged to:: Private residence Living Arrangements: Spouse/significant other Available Help at Discharge: Family;Available 24 hours/day Type of Home: House Home Access: Stairs to enter [employer] of Steps: 2 Entrance Stairs-Rails: Right Home Layout: Two level;Able to live on main level with bedroom/bathroom Alternate Level Stairs-Number of Steps: 1 flight Alternate Level Stairs-Rails: Right Bathroom Shower/Tub: Hospital doctor  Toilet: Standard     Home Equipment: Cane - single point (access to shower chair and 3 in 1)          Prior Functioning/Environment Level of Independence: Independent             OT Diagnosis: Acute pain   OT Problem List:     OT Treatment/Interventions:      OT Goals(Current goals can be found in the care plan section)    OT Frequency:     Barriers to D/C:            Co-evaluation              End of  Session Equipment Utilized During Treatment: Rolling walker;Gait belt;Left knee immobilizer CPM Left Knee CPM Left Knee: Off  Activity Tolerance: Patient tolerated treatment well Patient left: in bed;with call bell/phone within reach;with family/visitor present   Time: 1443-1540 OT Time Calculation (min): 20 min Charges:  OT General Charges $OT Visit: 1 Procedure OT Evaluation $Initial OT Evaluation Tier I: 1 Procedure G-CodesBenito Mccreedy OTR/L [phone] 06/01/2015, 9:36 AM

## 2015-06-01 NOTE — Care Management Note (Addendum)
Case Management Note  Patient Details  Name: REAL CONA MRN: [id number] Date of Birth: Jun 27, 1951  Subjective/Objective:                   LEFT TOTAL KNEE ARTHROPLASTY (Left) Action/Plan: Discharge planning  Expected Discharge Date:  06/01/15               Expected Discharge Plan:  [place]  In-House Referral:     Discharge planning Services  CM Consult  Post Acute Care Choice:  Home Health Choice offered to:  Patient  DME Arranged:  Walker rolling DME Agency:  [place]:  PT [facility] Agency:  [place]  Status of Service:  Completed, signed off  Medicare Important Message Given:    Date Medicare IM Given:    Medicare IM give by:    Date Additional Medicare IM Given:    Additional Medicare Important Message give by:     If discussed at [place] of Stay Meetings, dates discussed:    Additional Comments: 12:00 CM received callback and [facility] accepts referral and SOC will be within 24 hours of discharge.  No other CM needs were communicated.  CM met with pt in room to offer choice of home health agency.  Pt [facility]) [phone].  CM called [place] [facility] and left voicemail for rep to please call back.  CM faxed facesheet, orders, F2F, H&P, OP note, Progress note, PT EVAL note to [phone] as instructed by voicemail.  CM called [facility] DME rep, Lecretia to please deliver the rolling walker to room prior to discharge today.  CM waiting for callback from [facility].   Dellie Catholic, RN 06/01/2015, 11:04 AM

## 2015-06-01 NOTE — Discharge Summary (Signed)
Physician Discharge Summary   Patient ID: Darren Rivers: [id number] DOB/AGE: [birth year] 62 y.o.  Admit date: 05/31/2015 Discharge date: 06/01/2015  Primary Diagnosis:  Osteoarthritis Left knee(s)  Admission Diagnoses:  Past Medical History  Diagnosis Date  . COPD (chronic obstructive pulmonary disease)   . GERD (gastroesophageal reflux disease)   . Arthritis    Discharge Diagnoses:   Principal Problem:   OA (osteoarthritis) of knee  Estimated body mass index is 23.86 kg/(m^2) as calculated from the following:   Height as of this encounter: 6' (1.829 m).   Weight as of this encounter: 79.833 kg (176 lb).  Procedure:  Procedure(s) (LRB): LEFT TOTAL KNEE ARTHROPLASTY (Left)   Consults: None  HPI: Darren Rivers is a 64 y.o. year old male with end stage OA of her left knee with progressively worsening pain and dysfunction. She has constant pain, with activity and at rest and significant functional deficits with difficulties even with ADLs. She has had extensive non-op management including analgesics, injections of cortisone and viscosupplements, and home exercise program, but remains in significant pain with significant dysfunction. Radiographs show bone on bone arthritis medial and patellofemoral. She presents now for left Total Knee Arthroplasty.   Laboratory Data: Admission on 05/31/2015  Component Date Value Ref Range Status  . WBC 06/01/2015 16.5* 4.0 - 10.5 K/uL Final  . RBC 06/01/2015 3.06* 4.22 - 5.81 MIL/uL Final  . Hemoglobin 06/01/2015 10.3* 13.0 - 17.0 g/dL Final  . HCT 06/01/2015 29.7* 39.0 - 52.0 % Final  . MCV 06/01/2015 97.1  78.0 - 100.0 fL Final  . MCH 06/01/2015 33.7  26.0 - 34.0 pg Final  . MCHC 06/01/2015 34.7  30.0 - 36.0 g/dL Final  . RDW 06/01/2015 13.2  11.5 - 15.5 % Final  . Platelets 06/01/2015 209  150 - 400 K/uL Final  . Sodium 06/01/2015 136  135 - 145 mmol/L Final  . Potassium 06/01/2015 4.5  3.5 - 5.1 mmol/L Final  . Chloride  06/01/2015 107  101 - 111 mmol/L Final  . CO2 06/01/2015 22  22 - 32 mmol/L Final  . Glucose, Bld 06/01/2015 158* 65 - 99 mg/dL Final  . BUN 06/01/2015 12  6 - 20 mg/dL Final  . Creatinine, Ser 06/01/2015 0.87  0.61 - 1.24 mg/dL Final  . Calcium 06/01/2015 8.3* 8.9 - 10.3 mg/dL Final  . GFR calc non Af Amer 06/01/2015 >60  >60 mL/min Final  . GFR calc Af Amer 06/01/2015 >60  >60 mL/min Final   Comment: (NOTE) The eGFR has been calculated using the CKD EPI equation. This calculation has not been validated in all clinical situations. eGFR's persistently <60 mL/min signify possible Chronic Kidney Disease.   Georgiann Hahn gap 06/01/2015 7  5 - 15 Final  Hospital Outpatient Visit on 05/27/2015  Component Date Value Ref Range Status  . aPTT 05/27/2015 29  24 - 37 seconds Final  . WBC 05/27/2015 7.4  4.0 - 10.5 K/uL Final  . RBC 05/27/2015 4.30  4.22 - 5.81 MIL/uL Final  . Hemoglobin 05/27/2015 14.2  13.0 - 17.0 g/dL Final  . HCT 05/27/2015 41.9  39.0 - 52.0 % Final  . MCV 05/27/2015 97.4  78.0 - 100.0 fL Final  . MCH 05/27/2015 33.0  26.0 - 34.0 pg Final  . MCHC 05/27/2015 33.9  30.0 - 36.0 g/dL Final  . RDW 05/27/2015 13.3  11.5 - 15.5 % Final  . Platelets 05/27/2015 243  150 - 400 K/uL Final  .  Sodium 05/27/2015 140  135 - 145 mmol/L Final  . Potassium 05/27/2015 4.7  3.5 - 5.1 mmol/L Final  . Chloride 05/27/2015 107  101 - 111 mmol/L Final  . CO2 05/27/2015 25  22 - 32 mmol/L Final  . Glucose, Bld 05/27/2015 88  65 - 99 mg/dL Final  . BUN 05/27/2015 9  6 - 20 mg/dL Final  . Creatinine, Ser 05/27/2015 0.94  0.61 - 1.24 mg/dL Final  . Calcium 05/27/2015 9.6  8.9 - 10.3 mg/dL Final  . Total Protein 05/27/2015 7.7  6.5 - 8.1 g/dL Final  . Albumin 05/27/2015 4.5  3.5 - 5.0 g/dL Final  . AST 05/27/2015 29  15 - 41 U/L Final  . ALT 05/27/2015 24  17 - 63 U/L Final  . Alkaline Phosphatase 05/27/2015 79  38 - 126 U/L Final  . Total Bilirubin 05/27/2015 0.7  0.3 - 1.2 mg/dL Final  . GFR calc  non Af Amer 05/27/2015 >60  >60 mL/min Final  . GFR calc Af Amer 05/27/2015 >60  >60 mL/min Final   Comment: (NOTE) The eGFR has been calculated using the CKD EPI equation. This calculation has not been validated in all clinical situations. eGFR's persistently <60 mL/min signify possible Chronic Kidney Disease.   . Anion gap 05/27/2015 8  5 - 15 Final  . Prothrombin Time 05/27/2015 13.2  11.6 - 15.2 seconds Final  . INR 05/27/2015 0.98  0.00 - 1.49 Final  . ABO/RH(D) 05/27/2015 O POS   Final  . Antibody Screen 05/27/2015 NEG   Final  . Sample Expiration 05/27/2015 06/03/2015   Final  . Color, Urine 05/27/2015 YELLOW  YELLOW Final  . APPearance 05/27/2015 CLEAR  CLEAR Final  . Specific Gravity, Urine 05/27/2015 1.005  1.005 - 1.030 Final  . pH 05/27/2015 6.5  5.0 - 8.0 Final  . Glucose, UA 05/27/2015 NEGATIVE  NEGATIVE mg/dL Final  . Hgb urine dipstick 05/27/2015 NEGATIVE  NEGATIVE Final  . Bilirubin Urine 05/27/2015 NEGATIVE  NEGATIVE Final  . Ketones, ur 05/27/2015 NEGATIVE  NEGATIVE mg/dL Final  . Protein, ur 05/27/2015 NEGATIVE  NEGATIVE mg/dL Final  . Urobilinogen, UA 05/27/2015 0.2  0.0 - 1.0 mg/dL Final  . Nitrite 05/27/2015 NEGATIVE  NEGATIVE Final  . Leukocytes, UA 05/27/2015 NEGATIVE  NEGATIVE Final   MICROSCOPIC NOT DONE ON URINES WITH NEGATIVE PROTEIN, BLOOD, LEUKOCYTES, NITRITE, OR GLUCOSE <1000 mg/dL.  Marland Kitchen MRSA, PCR 05/27/2015 NEGATIVE  NEGATIVE Final  . Staphylococcus aureus 05/27/2015 NEGATIVE  NEGATIVE Final   Comment:        The Xpert SA Assay (FDA approved for NASAL specimens in patients over 60 years of age), is one component of a comprehensive surveillance program.  Test performance has been validated by [facility] for patients greater than or equal to 37 year old. It is not intended to diagnose infection nor to guide or monitor treatment.   . ABO/RH(D) 05/27/2015 O POS   Final     X-Rays:No results found.  EKG:No orders found for this or any previous  visit.   Hospital Course: Darren Rivers is a 64 y.o. who was admitted to [facility]. They were brought to the operating room on 05/31/2015 and underwent Procedure(s): LEFT TOTAL KNEE ARTHROPLASTY.  Patient tolerated the procedure well and was later transferred to the recovery room and then to the orthopaedic floor for postoperative care.  They were given PO and IV analgesics for pain control following their surgery.  They were given 24 hours  of postoperative antibiotics of  Anti-infectives    Start     Dose/Rate Route Frequency Ordered Stop   05/31/15 1400  ceFAZolin (ANCEF) IVPB 2 g/50 mL premix     2 g 100 mL/hr over 30 Minutes Intravenous Every 6 hours 05/31/15 1144 05/31/15 1959   05/31/15 0634  ceFAZolin (ANCEF) IVPB 2 g/50 mL premix     2 g 100 mL/hr over 30 Minutes Intravenous On call to O.R. 05/31/15 6950 05/31/15 0759     and started on DVT prophylaxis in the form of Xarelto.   PT and OT were ordered for total joint protocol.  Discharge planning consulted to help with postop disposition and equipment needs.  Patient had a good night on the evening of surgery and ended up walking about 50 feet that evening.  They started to get up OOB with therapy again on day one. Hemovac drain was pulled without difficulty.   Dressing was checked and it was clean and dry.  Patient was seen in rounds on POD 1 and it was felt that as long as they met their PT goals that they would be ready to go home later that same day.  Diet - Cardiac diet Follow up - in 2 weeks Activity - WBAT Disposition - Home Condition Upon Discharge - good D/C Meds - See DC Summary DVT Prophylaxis - Xarelto   Discharge Instructions    Call MD / Call 911    Complete by:  As directed   If you experience chest pain or shortness of breath, CALL 911 and be transported to the hospital emergency room.  If you develope a fever above 101 F, pus (white drainage) or increased drainage or redness at the wound, or calf pain,  call your surgeon's office.     Change dressing    Complete by:  As directed   Change dressing daily with sterile 4 x 4 inch gauze dressing and apply TED hose. Do not submerge the incision under water.     Constipation Prevention    Complete by:  As directed   Drink plenty of fluids.  Prune juice may be helpful.  You may use a stool softener, such as Colace (over the counter) 100 mg twice a day.  Use MiraLax (over the counter) for constipation as needed.     Diet - low sodium heart healthy    Complete by:  As directed      Discharge instructions    Complete by:  As directed   Pick up stool softner and laxative for home use following surgery while on pain medications. Do not submerge incision under water. May remove the surgical dressing tomorrow, Wednesday 06/02/2015, and then apply a dry gauze dressing to the incision daily. Please use good hand washing techniques while changing dressing each day. May shower starting three days after surgery on Thursday 06/03/2015. Please use a clean towel to pat the incision dry following showers. Continue to use ice for pain and swelling after surgery. Do not use any lotions or creams on the incision until instructed by your surgeon.  Take Xarelto for a total of three weeks, then discontinue Xarelto. Once the patient has completed the Xarelto, they may resume the 81 mg Aspirin.  Postoperative Constipation Protocol  Constipation - defined medically as fewer than three stools per week and severe constipation as less than one stool per week.  One of the most common issues patients have following surgery is constipation.  Even if you have a  regular bowel pattern at home, your normal regimen is likely to be disrupted due to multiple reasons following surgery.  Combination of anesthesia, postoperative narcotics, change in appetite and fluid intake all can affect your bowels.  In order to avoid complications following surgery, here are some recommendations in  order to help you during your recovery period.  Colace (docusate) - Pick up an over-the-counter form of Colace or another stool softener and take twice a day as long as you are requiring postoperative pain medications.  Take with a full glass of water daily.  If you experience loose stools or diarrhea, hold the colace until you stool forms back up.  If your symptoms do not get better within 1 week or if they get worse, check with your doctor.  Dulcolax (bisacodyl) - Pick up over-the-counter and take as directed by the product packaging as needed to assist with the movement of your bowels.  Take with a full glass of water.  Use this product as needed if not relieved by Colace only.   MiraLax (polyethylene glycol) - Pick up over-the-counter to have on hand.  MiraLax is a solution that will increase the amount of water in your bowels to assist with bowel movements.  Take as directed and can mix with a glass of water, juice, soda, coffee, or tea.  Take if you go more than two days without a movement. Do not use MiraLax more than once per day. Call your doctor if you are still constipated or irregular after using this medication for 7 days in a row.  If you continue to have problems with postoperative constipation, please contact the office for further assistance and recommendations.  If you experience "the worst abdominal pain ever" or develop nausea or vomiting, please contact the office immediatly for further recommendations for treatment.     Do not put a pillow under the knee. Place it under the heel.    Complete by:  As directed      Do not sit on low chairs, stoools or toilet seats, as it may be difficult to get up from low surfaces    Complete by:  As directed      Driving restrictions    Complete by:  As directed   No driving until released by the physician.     Increase activity slowly as tolerated    Complete by:  As directed      Lifting restrictions    Complete by:  As directed   No  lifting until released by the physician.     Patient may shower    Complete by:  As directed   You may shower without a dressing once there is no drainage.  Do not wash over the wound.  If drainage remains, do not shower until drainage stops.     TED hose    Complete by:  As directed   Use stockings (TED hose) for 3 weeks on both leg(s).  You may remove them at night for sleeping.     Weight bearing as tolerated    Complete by:  As directed   Laterality:  left  Extremity:  Lower            Medication List    STOP taking these medications        aspirin 81 MG tablet      TAKE these medications        atorvastatin 40 MG tablet  Commonly known as:  LIPITOR  Take  20 mg by mouth daily.     dorzolamide-timolol 22.3-6.8 MG/ML ophthalmic solution  Commonly known as:  COSOPT  Place 1 drop into both eyes 2 (two) times daily.     methocarbamol 500 MG tablet  Commonly known as:  ROBAXIN  Take 1 tablet (500 mg total) by mouth every 6 (six) hours as needed for muscle spasms.     omeprazole 20 MG capsule  Commonly known as:  PRILOSEC  Take 20 mg by mouth daily.     oxyCODONE 5 MG immediate release tablet  Commonly known as:  Oxy IR/ROXICODONE  Take 1-2 tablets (5-10 mg total) by mouth every 3 (three) hours as needed for moderate pain or severe pain.     rivaroxaban 10 MG Tabs tablet  Commonly known as:  XARELTO  Take 1 tablet (10 mg total) by mouth daily with breakfast. Take Xarelto for a total of three weeks, then discontinue Xarelto. Once the patient has completed the Xarelto, they may resume the 81 mg Aspirin.     traMADol 50 MG tablet  Commonly known as:  ULTRAM  Take 1-2 tablets (50-100 mg total) by mouth every 6 (six) hours as needed (mild pain).           Follow-up Information    Follow up with Gearlean Alf, MD. Schedule an appointment as soon as possible for a visit on 06/15/2015.   Specialty:  Orthopedic Surgery   Why:  Call office at [phone] to setup follow  up appointment on Tuesday 06/15/2015 with Dr. Wynelle Link.   Contact information:   [address] [place] [address] [phone]       Signed: Arlee Muslim, PA-C Orthopaedic Surgery 06/01/2015, 8:03 AM

## 2015-06-01 NOTE — Progress Notes (Signed)
   Subjective: 1 Day Post-Op Procedure(s) (LRB): LEFT TOTAL KNEE ARTHROPLASTY (Left) Patient reports pain as mild.   Patient seen in rounds with Dr. Wynelle Link. Patient is well, and has had no acute complaints or problems We will resume therapy today. He was able to walk about 50 feet the day of surgery.  Will get up twice today. Plan is to go home after hospital stay. If he does well with therapy and meets all his goals (including stairs), and if his pain is well controlled, then he might be ready to go home later this afternoon.  Objective: Vital signs in last 24 hours: Temp:  [95 F (35 C)-98.7 F (37.1 C)] 97.4 F (36.3 C) (09/27 0554) Pulse Rate:  [42-78] 78 (09/27 0554) Resp:  [10-18] 16 (09/27 0554) BP: (96-128)/(54-72) 97/72 mmHg (09/27 0554) SpO2:  [97 %-100 %] 98 % (09/27 0554)  Intake/Output from previous day:  Intake/Output Summary (Last 24 hours) at 06/01/15 0746 Last data filed at 06/01/15 1610  Gross per 24 hour  Intake 5444.98 ml  Output   3610 ml  Net 1834.98 ml    Intake/Output this shift: UOP 1475 since around [place]  Labs:  Recent Labs  06/01/15 0515  HGB 10.3*    Recent Labs  06/01/15 0515  WBC 16.5*  RBC 3.06*  HCT 29.7*  PLT 209    Recent Labs  06/01/15 0515  NA 136  K 4.5  CL 107  CO2 22  BUN 12  CREATININE 0.87  GLUCOSE 158*  CALCIUM 8.3*   No results for input(s): LABPT, INR in the last 72 hours.  EXAM General - Patient is Alert, Appropriate and Oriented Extremity - Neurovascular intact Sensation intact distally Dorsiflexion/Plantar flexion intact Dressing - dressing C/D/I Motor Function - intact, moving foot and toes well on exam.  Hemovac pulled without difficulty.  Past Medical History  Diagnosis Date  . COPD (chronic obstructive pulmonary disease)   . GERD (gastroesophageal reflux disease)   . Arthritis     Assessment/Plan: 1 Day Post-Op Procedure(s) (LRB): LEFT TOTAL KNEE ARTHROPLASTY (Left) Principal  Problem:   OA (osteoarthritis) of knee  Estimated body mass index is 23.86 kg/(m^2) as calculated from the following:   Height as of this encounter: 6' (1.829 m).   Weight as of this encounter: 79.833 kg (176 lb). Advance diet Up with therapy Discharge home with home health today or tomorrow depending upon progress.  DVT Prophylaxis - Xarelto Weight-Bearing as tolerated to left leg D/C O2 and Pulse OX and try on Room Air  If he meets all goals and ready to go home later today: Diet - Cardiac diet Follow up - in 2 weeks Activity - WBAT Disposition - Home Condition Upon Discharge - Pending his therapy D/C Meds - See DC Summary DVT Prophylaxis - [place], PA-C Orthopaedic Surgery 06/01/2015, 7:46 AM

## 2015-06-01 NOTE — Progress Notes (Signed)
Physical Therapy Treatment Patient Details Name: Darren Rivers MRN: [id number] DOB: 1951-08-22 Today's Date: 06/01/2015    History of Present Illness 64 y.o. male s/p L TKA 05/31/15    PT Comments    Continuing to perform well. Pain rated 3-4/10 with mobility. Practiced stairs a 2nd time with wife present. All education completed. Ready to d/c from PT standpoint.   Follow Up Recommendations  Home health PT     Equipment Recommendations  Rolling walker with 5" wheels    Recommendations for Other Services       Precautions / Restrictions Precautions Precautions: Knee Required Braces or Orthoses: Knee Immobilizer - Left Knee Immobilizer - Left: Discontinue once straight leg raise with < 10 degree lag Restrictions Weight Bearing Restrictions: No LLE Weight Bearing: Weight bearing as tolerated    Mobility  Bed Mobility Overal bed mobility: Needs Assistance Bed Mobility: Supine to Sit;Sit to Supine     Supine to sit: Supervision Sit to supine: Supervision   General bed mobility comments: close guard for safety  Transfers Overall transfer level: Needs assistance Equipment used: Rolling walker (2 wheeled) Transfers: Sit to/from Stand Sit to Stand: Supervision         General transfer comment: close guard for safety  Ambulation/Gait Ambulation/Gait assistance: Supervision Ambulation Distance (Feet): 200 Feet Assistive device: Rolling walker (2 wheeled) Gait Pattern/deviations: Step-through pattern;Antalgic;Trunk flexed     General Gait Details: close guard for safety   Stairs Stairs: Yes Stairs assistance: Min guard Stair Management: One rail Left;Forwards Number of Stairs: 4 General stair comments: VCs safety, technique, sequence. close guard for safety  Wheelchair Mobility    Modified Rankin (Stroke Patients Only)       Balance                                    Cognition Arousal/Alertness: Awake/alert Behavior During  Therapy: WFL for tasks assessed/performed Overall Cognitive Status: Within Functional Limits for tasks assessed                      Exercises      General Comments        Pertinent Vitals/Pain Pain Assessment: 0-10 Pain Score: 7  Pain Location: L knee Pain Descriptors / Indicators: Aching;Sore Pain Intervention(s): Monitored during session;Ice applied;Repositioned    Home Living                      Prior Function            PT Goals (current goals can now be found in the care plan section) Progress towards PT goals: Progressing toward goals    Frequency  7X/week    PT Plan Current plan remains appropriate    Co-evaluation             End of Session Equipment Utilized During Treatment: Gait belt Activity Tolerance: Patient tolerated treatment well Patient left: in bed;with call bell/phone within reach;with family/visitor present     Time: 4034-7425 PT Time Calculation (min) (ACUTE ONLY): 9 min  Charges:  $Gait Training: 8-22 mins                    G Codes:      Weston Anna, MPT Pager: [phone]

## 2017-11-27 DIAGNOSIS — H401334 Pigmentary glaucoma, bilateral, indeterminate stage: Secondary | ICD-10-CM | POA: Diagnosis not present

## 2017-11-27 DIAGNOSIS — H3562 Retinal hemorrhage, left eye: Secondary | ICD-10-CM | POA: Diagnosis not present

## 2017-11-27 DIAGNOSIS — H2513 Age-related nuclear cataract, bilateral: Secondary | ICD-10-CM | POA: Diagnosis not present

## 2017-11-27 DIAGNOSIS — H4321 Crystalline deposits in vitreous body, right eye: Secondary | ICD-10-CM | POA: Diagnosis not present

## 2017-11-28 ENCOUNTER — Encounter (INDEPENDENT_AMBULATORY_CARE_PROVIDER_SITE_OTHER): Payer: Medicare Other | Admitting: Ophthalmology

## 2017-11-28 DIAGNOSIS — H4321 Crystalline deposits in vitreous body, right eye: Secondary | ICD-10-CM

## 2017-11-28 DIAGNOSIS — H3562 Retinal hemorrhage, left eye: Secondary | ICD-10-CM

## 2017-11-28 DIAGNOSIS — H2513 Age-related nuclear cataract, bilateral: Secondary | ICD-10-CM | POA: Diagnosis not present

## 2017-11-28 DIAGNOSIS — H43813 Vitreous degeneration, bilateral: Secondary | ICD-10-CM | POA: Diagnosis not present

## 2017-12-19 ENCOUNTER — Encounter (INDEPENDENT_AMBULATORY_CARE_PROVIDER_SITE_OTHER): Payer: Medicare Other | Admitting: Ophthalmology

## 2017-12-19 DIAGNOSIS — H3562 Retinal hemorrhage, left eye: Secondary | ICD-10-CM | POA: Diagnosis not present

## 2017-12-19 DIAGNOSIS — H43813 Vitreous degeneration, bilateral: Secondary | ICD-10-CM

## 2018-08-15 DIAGNOSIS — R918 Other nonspecific abnormal finding of lung field: Secondary | ICD-10-CM | POA: Diagnosis not present

## 2018-08-15 DIAGNOSIS — K219 Gastro-esophageal reflux disease without esophagitis: Secondary | ICD-10-CM | POA: Diagnosis not present

## 2018-08-15 DIAGNOSIS — Z720 Tobacco use: Secondary | ICD-10-CM | POA: Diagnosis not present

## 2018-08-15 DIAGNOSIS — H409 Unspecified glaucoma: Secondary | ICD-10-CM | POA: Diagnosis not present

## 2018-08-15 DIAGNOSIS — E7800 Pure hypercholesterolemia, unspecified: Secondary | ICD-10-CM | POA: Diagnosis not present

## 2018-08-15 DIAGNOSIS — R739 Hyperglycemia, unspecified: Secondary | ICD-10-CM | POA: Diagnosis not present

## 2018-08-15 DIAGNOSIS — Z13220 Encounter for screening for lipoid disorders: Secondary | ICD-10-CM | POA: Diagnosis not present

## 2018-08-20 DIAGNOSIS — Z6826 Body mass index (BMI) 26.0-26.9, adult: Secondary | ICD-10-CM | POA: Diagnosis not present

## 2018-08-20 DIAGNOSIS — F5221 Male erectile disorder: Secondary | ICD-10-CM | POA: Diagnosis not present

## 2018-08-20 DIAGNOSIS — R739 Hyperglycemia, unspecified: Secondary | ICD-10-CM | POA: Diagnosis not present

## 2018-08-20 DIAGNOSIS — E7800 Pure hypercholesterolemia, unspecified: Secondary | ICD-10-CM | POA: Diagnosis not present

## 2018-08-20 DIAGNOSIS — Z0000 Encounter for general adult medical examination without abnormal findings: Secondary | ICD-10-CM | POA: Diagnosis not present

## 2018-08-20 DIAGNOSIS — B001 Herpesviral vesicular dermatitis: Secondary | ICD-10-CM | POA: Diagnosis not present

## 2018-08-20 DIAGNOSIS — Z23 Encounter for immunization: Secondary | ICD-10-CM | POA: Diagnosis not present

## 2018-08-20 DIAGNOSIS — H409 Unspecified glaucoma: Secondary | ICD-10-CM | POA: Diagnosis not present

## 2018-08-26 ENCOUNTER — Other Ambulatory Visit: Payer: Self-pay | Admitting: Family Medicine

## 2018-08-26 DIAGNOSIS — R918 Other nonspecific abnormal finding of lung field: Secondary | ICD-10-CM

## 2018-08-29 ENCOUNTER — Other Ambulatory Visit: Payer: Self-pay | Admitting: Family Medicine

## 2018-08-29 DIAGNOSIS — R918 Other nonspecific abnormal finding of lung field: Secondary | ICD-10-CM

## 2018-08-30 ENCOUNTER — Ambulatory Visit
Admission: RE | Admit: 2018-08-30 | Discharge: 2018-08-30 | Disposition: A | Discharge status: Home / Self Care | Payer: Medicare Other | Source: Ambulatory Visit | Attending: Family Medicine | Admitting: Family Medicine

## 2018-08-30 ENCOUNTER — Inpatient Hospital Stay: Admission: RE | Admit: 2018-08-30 | Payer: PRIVATE HEALTH INSURANCE | Source: Ambulatory Visit

## 2018-08-30 DIAGNOSIS — R918 Other nonspecific abnormal finding of lung field: Secondary | ICD-10-CM | POA: Diagnosis not present

## 2018-08-30 DIAGNOSIS — J432 Centrilobular emphysema: Secondary | ICD-10-CM | POA: Diagnosis not present

## 2018-09-11 DIAGNOSIS — Z1331 Encounter for screening for depression: Secondary | ICD-10-CM | POA: Diagnosis not present

## 2018-09-11 DIAGNOSIS — B001 Herpesviral vesicular dermatitis: Secondary | ICD-10-CM | POA: Diagnosis not present

## 2018-09-11 DIAGNOSIS — E7800 Pure hypercholesterolemia, unspecified: Secondary | ICD-10-CM | POA: Diagnosis not present

## 2018-09-11 DIAGNOSIS — J069 Acute upper respiratory infection, unspecified: Secondary | ICD-10-CM | POA: Diagnosis not present

## 2018-09-11 DIAGNOSIS — Z1389 Encounter for screening for other disorder: Secondary | ICD-10-CM | POA: Diagnosis not present

## 2018-09-11 DIAGNOSIS — E782 Mixed hyperlipidemia: Secondary | ICD-10-CM | POA: Diagnosis not present

## 2018-09-11 DIAGNOSIS — F5221 Male erectile disorder: Secondary | ICD-10-CM | POA: Diagnosis not present

## 2018-09-11 DIAGNOSIS — R739 Hyperglycemia, unspecified: Secondary | ICD-10-CM | POA: Diagnosis not present

## 2019-05-07 DIAGNOSIS — R0602 Shortness of breath: Secondary | ICD-10-CM | POA: Diagnosis not present

## 2019-05-07 DIAGNOSIS — J449 Chronic obstructive pulmonary disease, unspecified: Secondary | ICD-10-CM | POA: Diagnosis not present

## 2019-05-07 DIAGNOSIS — R0989 Other specified symptoms and signs involving the circulatory and respiratory systems: Secondary | ICD-10-CM | POA: Diagnosis not present

## 2019-05-07 DIAGNOSIS — R911 Solitary pulmonary nodule: Secondary | ICD-10-CM | POA: Diagnosis not present

## 2019-05-20 DIAGNOSIS — J449 Chronic obstructive pulmonary disease, unspecified: Secondary | ICD-10-CM | POA: Diagnosis not present

## 2019-05-23 DIAGNOSIS — J449 Chronic obstructive pulmonary disease, unspecified: Secondary | ICD-10-CM | POA: Diagnosis not present

## 2019-05-27 DIAGNOSIS — J449 Chronic obstructive pulmonary disease, unspecified: Secondary | ICD-10-CM | POA: Diagnosis not present

## 2019-06-18 DIAGNOSIS — R911 Solitary pulmonary nodule: Secondary | ICD-10-CM | POA: Diagnosis not present

## 2019-06-18 DIAGNOSIS — R0989 Other specified symptoms and signs involving the circulatory and respiratory systems: Secondary | ICD-10-CM | POA: Diagnosis not present

## 2019-06-18 DIAGNOSIS — Z23 Encounter for immunization: Secondary | ICD-10-CM | POA: Diagnosis not present

## 2019-06-18 DIAGNOSIS — R0602 Shortness of breath: Secondary | ICD-10-CM | POA: Diagnosis not present

## 2019-06-18 DIAGNOSIS — J449 Chronic obstructive pulmonary disease, unspecified: Secondary | ICD-10-CM | POA: Diagnosis not present

## 2019-07-28 DIAGNOSIS — R0602 Shortness of breath: Secondary | ICD-10-CM | POA: Diagnosis not present

## 2019-07-28 DIAGNOSIS — R0989 Other specified symptoms and signs involving the circulatory and respiratory systems: Secondary | ICD-10-CM | POA: Diagnosis not present

## 2019-07-28 DIAGNOSIS — J449 Chronic obstructive pulmonary disease, unspecified: Secondary | ICD-10-CM | POA: Diagnosis not present

## 2019-07-28 DIAGNOSIS — R911 Solitary pulmonary nodule: Secondary | ICD-10-CM | POA: Diagnosis not present

## 2019-08-01 DIAGNOSIS — R739 Hyperglycemia, unspecified: Secondary | ICD-10-CM | POA: Diagnosis not present

## 2019-08-01 DIAGNOSIS — E7800 Pure hypercholesterolemia, unspecified: Secondary | ICD-10-CM | POA: Diagnosis not present

## 2019-08-01 DIAGNOSIS — K219 Gastro-esophageal reflux disease without esophagitis: Secondary | ICD-10-CM | POA: Diagnosis not present

## 2019-08-01 DIAGNOSIS — F5221 Male erectile disorder: Secondary | ICD-10-CM | POA: Diagnosis not present

## 2019-08-01 DIAGNOSIS — E782 Mixed hyperlipidemia: Secondary | ICD-10-CM | POA: Diagnosis not present

## 2019-08-06 DIAGNOSIS — K219 Gastro-esophageal reflux disease without esophagitis: Secondary | ICD-10-CM | POA: Diagnosis not present

## 2019-08-06 DIAGNOSIS — Z6826 Body mass index (BMI) 26.0-26.9, adult: Secondary | ICD-10-CM | POA: Diagnosis not present

## 2019-08-06 DIAGNOSIS — E782 Mixed hyperlipidemia: Secondary | ICD-10-CM | POA: Diagnosis not present

## 2019-08-06 DIAGNOSIS — N400 Enlarged prostate without lower urinary tract symptoms: Secondary | ICD-10-CM | POA: Diagnosis not present

## 2019-08-06 DIAGNOSIS — Z0000 Encounter for general adult medical examination without abnormal findings: Secondary | ICD-10-CM | POA: Diagnosis not present

## 2019-10-15 DIAGNOSIS — Z86010 Personal history of colonic polyps: Secondary | ICD-10-CM | POA: Diagnosis not present

## 2019-10-28 DIAGNOSIS — J449 Chronic obstructive pulmonary disease, unspecified: Secondary | ICD-10-CM | POA: Diagnosis not present

## 2019-10-28 DIAGNOSIS — R911 Solitary pulmonary nodule: Secondary | ICD-10-CM | POA: Diagnosis not present

## 2019-10-28 DIAGNOSIS — R0989 Other specified symptoms and signs involving the circulatory and respiratory systems: Secondary | ICD-10-CM | POA: Diagnosis not present

## 2019-10-28 DIAGNOSIS — K219 Gastro-esophageal reflux disease without esophagitis: Secondary | ICD-10-CM | POA: Diagnosis not present

## 2019-10-28 DIAGNOSIS — Z87891 Personal history of nicotine dependence: Secondary | ICD-10-CM | POA: Diagnosis not present

## 2019-10-28 DIAGNOSIS — R0602 Shortness of breath: Secondary | ICD-10-CM | POA: Diagnosis not present

## 2019-11-01 DIAGNOSIS — Z23 Encounter for immunization: Secondary | ICD-10-CM | POA: Diagnosis not present

## 2019-11-11 DIAGNOSIS — Z01818 Encounter for other preprocedural examination: Secondary | ICD-10-CM | POA: Diagnosis not present

## 2019-11-13 DIAGNOSIS — D120 Benign neoplasm of cecum: Secondary | ICD-10-CM | POA: Diagnosis not present

## 2019-11-13 DIAGNOSIS — Z800 Family history of malignant neoplasm of digestive organs: Secondary | ICD-10-CM | POA: Diagnosis not present

## 2019-11-13 DIAGNOSIS — Z87891 Personal history of nicotine dependence: Secondary | ICD-10-CM | POA: Diagnosis not present

## 2019-11-13 DIAGNOSIS — Z8371 Family history of colonic polyps: Secondary | ICD-10-CM | POA: Diagnosis not present

## 2019-11-13 DIAGNOSIS — K640 First degree hemorrhoids: Secondary | ICD-10-CM | POA: Diagnosis not present

## 2019-11-13 DIAGNOSIS — J439 Emphysema, unspecified: Secondary | ICD-10-CM | POA: Diagnosis not present

## 2019-11-13 DIAGNOSIS — Z79899 Other long term (current) drug therapy: Secondary | ICD-10-CM | POA: Diagnosis not present

## 2019-11-13 DIAGNOSIS — Z1211 Encounter for screening for malignant neoplasm of colon: Secondary | ICD-10-CM | POA: Diagnosis not present

## 2019-11-13 DIAGNOSIS — K644 Residual hemorrhoidal skin tags: Secondary | ICD-10-CM | POA: Diagnosis not present

## 2019-11-13 DIAGNOSIS — K219 Gastro-esophageal reflux disease without esophagitis: Secondary | ICD-10-CM | POA: Diagnosis not present

## 2019-11-13 DIAGNOSIS — E7800 Pure hypercholesterolemia, unspecified: Secondary | ICD-10-CM | POA: Diagnosis not present

## 2019-11-13 DIAGNOSIS — D122 Benign neoplasm of ascending colon: Secondary | ICD-10-CM | POA: Diagnosis not present

## 2019-11-13 DIAGNOSIS — Z86010 Personal history of colonic polyps: Secondary | ICD-10-CM | POA: Diagnosis not present

## 2019-11-29 DIAGNOSIS — Z23 Encounter for immunization: Secondary | ICD-10-CM | POA: Diagnosis not present

## 2019-12-01 DIAGNOSIS — D126 Benign neoplasm of colon, unspecified: Secondary | ICD-10-CM | POA: Diagnosis not present

## 2020-01-05 DIAGNOSIS — H532 Diplopia: Secondary | ICD-10-CM | POA: Diagnosis not present

## 2020-01-19 DIAGNOSIS — H4321 Crystalline deposits in vitreous body, right eye: Secondary | ICD-10-CM | POA: Diagnosis not present

## 2020-01-19 DIAGNOSIS — H532 Diplopia: Secondary | ICD-10-CM | POA: Diagnosis not present

## 2020-01-20 DIAGNOSIS — H532 Diplopia: Secondary | ICD-10-CM | POA: Diagnosis not present

## 2020-02-11 DIAGNOSIS — H532 Diplopia: Secondary | ICD-10-CM | POA: Diagnosis not present

## 2020-03-01 DIAGNOSIS — H5043 Accommodative component in esotropia: Secondary | ICD-10-CM | POA: Diagnosis not present

## 2020-04-12 DIAGNOSIS — H5043 Accommodative component in esotropia: Secondary | ICD-10-CM | POA: Diagnosis not present

## 2020-04-26 DIAGNOSIS — J449 Chronic obstructive pulmonary disease, unspecified: Secondary | ICD-10-CM | POA: Diagnosis not present

## 2020-04-26 DIAGNOSIS — R911 Solitary pulmonary nodule: Secondary | ICD-10-CM | POA: Diagnosis not present

## 2020-04-26 DIAGNOSIS — R0602 Shortness of breath: Secondary | ICD-10-CM | POA: Diagnosis not present

## 2020-04-26 DIAGNOSIS — R0989 Other specified symptoms and signs involving the circulatory and respiratory systems: Secondary | ICD-10-CM | POA: Diagnosis not present

## 2020-05-03 DIAGNOSIS — H401334 Pigmentary glaucoma, bilateral, indeterminate stage: Secondary | ICD-10-CM | POA: Diagnosis not present

## 2020-05-03 DIAGNOSIS — H524 Presbyopia: Secondary | ICD-10-CM | POA: Diagnosis not present

## 2020-05-31 DIAGNOSIS — Z87891 Personal history of nicotine dependence: Secondary | ICD-10-CM | POA: Diagnosis not present

## 2020-05-31 DIAGNOSIS — Z122 Encounter for screening for malignant neoplasm of respiratory organs: Secondary | ICD-10-CM | POA: Diagnosis not present

## 2020-06-02 DIAGNOSIS — D126 Benign neoplasm of colon, unspecified: Secondary | ICD-10-CM | POA: Diagnosis not present

## 2020-07-08 DIAGNOSIS — Z23 Encounter for immunization: Secondary | ICD-10-CM | POA: Diagnosis not present

## 2020-07-20 DIAGNOSIS — Z01818 Encounter for other preprocedural examination: Secondary | ICD-10-CM | POA: Diagnosis not present

## 2020-07-22 DIAGNOSIS — Z86010 Personal history of colonic polyps: Secondary | ICD-10-CM | POA: Diagnosis not present

## 2020-07-22 DIAGNOSIS — K644 Residual hemorrhoidal skin tags: Secondary | ICD-10-CM | POA: Diagnosis not present

## 2020-07-22 DIAGNOSIS — Z1211 Encounter for screening for malignant neoplasm of colon: Secondary | ICD-10-CM | POA: Diagnosis not present

## 2020-07-22 DIAGNOSIS — J449 Chronic obstructive pulmonary disease, unspecified: Secondary | ICD-10-CM | POA: Diagnosis not present

## 2020-07-22 DIAGNOSIS — Z96652 Presence of left artificial knee joint: Secondary | ICD-10-CM | POA: Diagnosis not present

## 2020-07-22 DIAGNOSIS — E7800 Pure hypercholesterolemia, unspecified: Secondary | ICD-10-CM | POA: Diagnosis not present

## 2020-07-22 DIAGNOSIS — K641 Second degree hemorrhoids: Secondary | ICD-10-CM | POA: Diagnosis not present

## 2020-08-05 DIAGNOSIS — E782 Mixed hyperlipidemia: Secondary | ICD-10-CM | POA: Diagnosis not present

## 2020-08-05 DIAGNOSIS — Z1329 Encounter for screening for other suspected endocrine disorder: Secondary | ICD-10-CM | POA: Diagnosis not present

## 2020-08-05 DIAGNOSIS — Z0001 Encounter for general adult medical examination with abnormal findings: Secondary | ICD-10-CM | POA: Diagnosis not present

## 2020-08-05 DIAGNOSIS — Z720 Tobacco use: Secondary | ICD-10-CM | POA: Diagnosis not present

## 2020-08-05 DIAGNOSIS — R739 Hyperglycemia, unspecified: Secondary | ICD-10-CM | POA: Diagnosis not present

## 2020-08-05 DIAGNOSIS — D513 Other dietary vitamin B12 deficiency anemia: Secondary | ICD-10-CM | POA: Diagnosis not present

## 2020-08-05 DIAGNOSIS — D509 Iron deficiency anemia, unspecified: Secondary | ICD-10-CM | POA: Diagnosis not present

## 2020-08-05 DIAGNOSIS — H409 Unspecified glaucoma: Secondary | ICD-10-CM | POA: Diagnosis not present

## 2020-08-05 DIAGNOSIS — N400 Enlarged prostate without lower urinary tract symptoms: Secondary | ICD-10-CM | POA: Diagnosis not present

## 2020-08-05 DIAGNOSIS — D520 Dietary folate deficiency anemia: Secondary | ICD-10-CM | POA: Diagnosis not present

## 2020-08-05 DIAGNOSIS — K219 Gastro-esophageal reflux disease without esophagitis: Secondary | ICD-10-CM | POA: Diagnosis not present

## 2020-08-05 DIAGNOSIS — F5221 Male erectile disorder: Secondary | ICD-10-CM | POA: Diagnosis not present

## 2020-08-10 DIAGNOSIS — Z23 Encounter for immunization: Secondary | ICD-10-CM | POA: Diagnosis not present

## 2020-08-10 DIAGNOSIS — R739 Hyperglycemia, unspecified: Secondary | ICD-10-CM | POA: Diagnosis not present

## 2020-08-10 DIAGNOSIS — Z6826 Body mass index (BMI) 26.0-26.9, adult: Secondary | ICD-10-CM | POA: Diagnosis not present

## 2020-08-10 DIAGNOSIS — Z0000 Encounter for general adult medical examination without abnormal findings: Secondary | ICD-10-CM | POA: Diagnosis not present

## 2020-08-10 DIAGNOSIS — J449 Chronic obstructive pulmonary disease, unspecified: Secondary | ICD-10-CM | POA: Diagnosis not present

## 2020-08-10 DIAGNOSIS — D649 Anemia, unspecified: Secondary | ICD-10-CM | POA: Diagnosis not present

## 2020-08-10 DIAGNOSIS — E782 Mixed hyperlipidemia: Secondary | ICD-10-CM | POA: Diagnosis not present

## 2020-08-10 DIAGNOSIS — N400 Enlarged prostate without lower urinary tract symptoms: Secondary | ICD-10-CM | POA: Diagnosis not present

## 2020-09-15 DIAGNOSIS — D649 Anemia, unspecified: Secondary | ICD-10-CM | POA: Diagnosis not present

## 2020-10-29 DIAGNOSIS — H401334 Pigmentary glaucoma, bilateral, indeterminate stage: Secondary | ICD-10-CM | POA: Diagnosis not present

## 2021-01-05 DIAGNOSIS — Z23 Encounter for immunization: Secondary | ICD-10-CM | POA: Diagnosis not present

## 2021-02-01 DIAGNOSIS — M65331 Trigger finger, right middle finger: Secondary | ICD-10-CM | POA: Diagnosis not present

## 2021-02-24 DIAGNOSIS — M65331 Trigger finger, right middle finger: Secondary | ICD-10-CM | POA: Diagnosis not present

## 2021-05-02 DIAGNOSIS — H401311 Pigmentary glaucoma, right eye, mild stage: Secondary | ICD-10-CM | POA: Diagnosis not present

## 2021-06-04 DIAGNOSIS — Z23 Encounter for immunization: Secondary | ICD-10-CM | POA: Diagnosis not present

## 2021-08-11 DIAGNOSIS — M25511 Pain in right shoulder: Secondary | ICD-10-CM | POA: Diagnosis not present

## 2021-08-26 DIAGNOSIS — M25511 Pain in right shoulder: Secondary | ICD-10-CM | POA: Diagnosis not present

## 2021-09-01 DIAGNOSIS — D649 Anemia, unspecified: Secondary | ICD-10-CM | POA: Diagnosis not present

## 2021-09-01 DIAGNOSIS — N200 Calculus of kidney: Secondary | ICD-10-CM | POA: Diagnosis not present

## 2021-09-01 DIAGNOSIS — Z87442 Personal history of urinary calculi: Secondary | ICD-10-CM | POA: Diagnosis not present

## 2021-09-01 DIAGNOSIS — R319 Hematuria, unspecified: Secondary | ICD-10-CM | POA: Diagnosis not present

## 2021-09-01 DIAGNOSIS — R309 Painful micturition, unspecified: Secondary | ICD-10-CM | POA: Diagnosis not present

## 2021-09-06 DIAGNOSIS — M13811 Other specified arthritis, right shoulder: Secondary | ICD-10-CM | POA: Diagnosis not present

## 2021-09-06 DIAGNOSIS — M75120 Complete rotator cuff tear or rupture of unspecified shoulder, not specified as traumatic: Secondary | ICD-10-CM | POA: Diagnosis not present

## 2021-09-07 DIAGNOSIS — N200 Calculus of kidney: Secondary | ICD-10-CM | POA: Diagnosis not present

## 2021-09-07 DIAGNOSIS — F5221 Male erectile disorder: Secondary | ICD-10-CM | POA: Diagnosis not present

## 2021-09-07 DIAGNOSIS — N401 Enlarged prostate with lower urinary tract symptoms: Secondary | ICD-10-CM | POA: Diagnosis not present

## 2021-10-13 DIAGNOSIS — D519 Vitamin B12 deficiency anemia, unspecified: Secondary | ICD-10-CM | POA: Diagnosis not present

## 2021-10-13 DIAGNOSIS — R739 Hyperglycemia, unspecified: Secondary | ICD-10-CM | POA: Diagnosis not present

## 2021-10-13 DIAGNOSIS — E7801 Familial hypercholesterolemia: Secondary | ICD-10-CM | POA: Diagnosis not present

## 2021-10-13 DIAGNOSIS — Z1329 Encounter for screening for other suspected endocrine disorder: Secondary | ICD-10-CM | POA: Diagnosis not present

## 2021-10-13 DIAGNOSIS — D649 Anemia, unspecified: Secondary | ICD-10-CM | POA: Diagnosis not present

## 2021-10-13 DIAGNOSIS — E7849 Other hyperlipidemia: Secondary | ICD-10-CM | POA: Diagnosis not present

## 2021-10-13 DIAGNOSIS — E7800 Pure hypercholesterolemia, unspecified: Secondary | ICD-10-CM | POA: Diagnosis not present

## 2021-10-13 DIAGNOSIS — N400 Enlarged prostate without lower urinary tract symptoms: Secondary | ICD-10-CM | POA: Diagnosis not present

## 2021-10-13 DIAGNOSIS — E782 Mixed hyperlipidemia: Secondary | ICD-10-CM | POA: Diagnosis not present

## 2021-10-13 DIAGNOSIS — J449 Chronic obstructive pulmonary disease, unspecified: Secondary | ICD-10-CM | POA: Diagnosis not present

## 2021-10-13 DIAGNOSIS — D529 Folate deficiency anemia, unspecified: Secondary | ICD-10-CM | POA: Diagnosis not present

## 2021-10-13 DIAGNOSIS — Z0001 Encounter for general adult medical examination with abnormal findings: Secondary | ICD-10-CM | POA: Diagnosis not present

## 2021-10-13 DIAGNOSIS — K219 Gastro-esophageal reflux disease without esophagitis: Secondary | ICD-10-CM | POA: Diagnosis not present

## 2021-10-18 DIAGNOSIS — K219 Gastro-esophageal reflux disease without esophagitis: Secondary | ICD-10-CM | POA: Diagnosis not present

## 2021-10-18 DIAGNOSIS — Z6826 Body mass index (BMI) 26.0-26.9, adult: Secondary | ICD-10-CM | POA: Diagnosis not present

## 2021-10-18 DIAGNOSIS — R739 Hyperglycemia, unspecified: Secondary | ICD-10-CM | POA: Diagnosis not present

## 2021-10-18 DIAGNOSIS — J449 Chronic obstructive pulmonary disease, unspecified: Secondary | ICD-10-CM | POA: Diagnosis not present

## 2021-10-18 DIAGNOSIS — B001 Herpesviral vesicular dermatitis: Secondary | ICD-10-CM | POA: Diagnosis not present

## 2021-10-18 DIAGNOSIS — E7849 Other hyperlipidemia: Secondary | ICD-10-CM | POA: Diagnosis not present

## 2021-11-17 DIAGNOSIS — H401131 Primary open-angle glaucoma, bilateral, mild stage: Secondary | ICD-10-CM | POA: Diagnosis not present

## 2021-12-01 DIAGNOSIS — M25511 Pain in right shoulder: Secondary | ICD-10-CM | POA: Diagnosis not present

## 2021-12-06 DIAGNOSIS — N200 Calculus of kidney: Secondary | ICD-10-CM | POA: Diagnosis not present

## 2021-12-06 DIAGNOSIS — N401 Enlarged prostate with lower urinary tract symptoms: Secondary | ICD-10-CM | POA: Diagnosis not present

## 2021-12-20 DIAGNOSIS — M12811 Other specific arthropathies, not elsewhere classified, right shoulder: Secondary | ICD-10-CM | POA: Diagnosis not present

## 2021-12-29 DIAGNOSIS — Z01818 Encounter for other preprocedural examination: Secondary | ICD-10-CM | POA: Diagnosis not present

## 2021-12-29 DIAGNOSIS — G8929 Other chronic pain: Secondary | ICD-10-CM | POA: Diagnosis not present

## 2021-12-29 DIAGNOSIS — M25511 Pain in right shoulder: Secondary | ICD-10-CM | POA: Diagnosis not present

## 2021-12-29 DIAGNOSIS — I10 Essential (primary) hypertension: Secondary | ICD-10-CM | POA: Diagnosis not present

## 2021-12-29 DIAGNOSIS — Z6825 Body mass index (BMI) 25.0-25.9, adult: Secondary | ICD-10-CM | POA: Diagnosis not present

## 2021-12-29 DIAGNOSIS — M7989 Other specified soft tissue disorders: Secondary | ICD-10-CM | POA: Diagnosis not present

## 2021-12-29 DIAGNOSIS — M12811 Other specific arthropathies, not elsewhere classified, right shoulder: Secondary | ICD-10-CM | POA: Diagnosis not present

## 2022-03-13 DIAGNOSIS — N138 Other obstructive and reflux uropathy: Secondary | ICD-10-CM | POA: Diagnosis not present

## 2022-03-13 DIAGNOSIS — N401 Enlarged prostate with lower urinary tract symptoms: Secondary | ICD-10-CM | POA: Diagnosis not present

## 2022-03-13 DIAGNOSIS — N200 Calculus of kidney: Secondary | ICD-10-CM | POA: Diagnosis not present

## 2022-05-04 DIAGNOSIS — H401111 Primary open-angle glaucoma, right eye, mild stage: Secondary | ICD-10-CM | POA: Diagnosis not present

## 2022-06-07 DIAGNOSIS — Z23 Encounter for immunization: Secondary | ICD-10-CM | POA: Diagnosis not present

## 2022-06-13 DIAGNOSIS — N401 Enlarged prostate with lower urinary tract symptoms: Secondary | ICD-10-CM | POA: Diagnosis not present

## 2022-06-13 DIAGNOSIS — N200 Calculus of kidney: Secondary | ICD-10-CM | POA: Diagnosis not present

## 2022-06-13 DIAGNOSIS — M545 Low back pain, unspecified: Secondary | ICD-10-CM | POA: Diagnosis not present

## 2022-06-19 DIAGNOSIS — S39012A Strain of muscle, fascia and tendon of lower back, initial encounter: Secondary | ICD-10-CM | POA: Diagnosis not present

## 2022-06-19 DIAGNOSIS — Z6826 Body mass index (BMI) 26.0-26.9, adult: Secondary | ICD-10-CM | POA: Diagnosis not present

## 2022-08-11 DIAGNOSIS — K219 Gastro-esophageal reflux disease without esophagitis: Secondary | ICD-10-CM | POA: Diagnosis not present

## 2022-08-11 DIAGNOSIS — Z87891 Personal history of nicotine dependence: Secondary | ICD-10-CM | POA: Diagnosis not present

## 2022-08-11 DIAGNOSIS — J449 Chronic obstructive pulmonary disease, unspecified: Secondary | ICD-10-CM | POA: Diagnosis not present

## 2022-08-11 DIAGNOSIS — R0600 Dyspnea, unspecified: Secondary | ICD-10-CM | POA: Diagnosis not present

## 2022-08-11 DIAGNOSIS — R918 Other nonspecific abnormal finding of lung field: Secondary | ICD-10-CM | POA: Diagnosis not present

## 2022-08-31 DIAGNOSIS — J449 Chronic obstructive pulmonary disease, unspecified: Secondary | ICD-10-CM | POA: Diagnosis not present

## 2022-09-05 DIAGNOSIS — J984 Other disorders of lung: Secondary | ICD-10-CM | POA: Diagnosis not present

## 2022-09-11 DIAGNOSIS — K219 Gastro-esophageal reflux disease without esophagitis: Secondary | ICD-10-CM | POA: Diagnosis not present

## 2022-09-11 DIAGNOSIS — J309 Allergic rhinitis, unspecified: Secondary | ICD-10-CM | POA: Diagnosis not present

## 2022-09-11 DIAGNOSIS — R0600 Dyspnea, unspecified: Secondary | ICD-10-CM | POA: Diagnosis not present

## 2022-09-11 DIAGNOSIS — J449 Chronic obstructive pulmonary disease, unspecified: Secondary | ICD-10-CM | POA: Diagnosis not present

## 2022-09-11 DIAGNOSIS — Z87891 Personal history of nicotine dependence: Secondary | ICD-10-CM | POA: Diagnosis not present

## 2022-09-11 DIAGNOSIS — R918 Other nonspecific abnormal finding of lung field: Secondary | ICD-10-CM | POA: Diagnosis not present

## 2022-09-13 ENCOUNTER — Ambulatory Visit (INDEPENDENT_AMBULATORY_CARE_PROVIDER_SITE_OTHER): Payer: Medicare Other | Admitting: Pulmonary Disease

## 2022-09-13 ENCOUNTER — Encounter: Payer: Self-pay | Admitting: Pulmonary Disease

## 2022-09-13 VITALS — BP 114/68 | HR 85 | Ht 72.0 in | Wt 184.2 lb

## 2022-09-13 DIAGNOSIS — J42 Unspecified chronic bronchitis: Secondary | ICD-10-CM | POA: Diagnosis not present

## 2022-09-13 DIAGNOSIS — Z87891 Personal history of nicotine dependence: Secondary | ICD-10-CM

## 2022-09-13 MED ORDER — FLUTICASONE-SALMETEROL 230-21 MCG/ACT IN AERO: 2.0000 | INHALATION_SPRAY | Freq: Two times a day (BID) | RESPIRATORY_TRACT | 12 refills | Status: DC

## 2022-09-13 NOTE — Patient Instructions (Addendum)
Start advair inhaler 2 puffs twice daily - rinse mouth out after each use  Stop flovent inhaler inhaler while using advair  We will refer you to our lung cancer screening program  Continue mucinex as needed  Follow up in 2 months with pulmonary function tests

## 2022-09-13 NOTE — Progress Notes (Signed)
Synopsis: Referred in January 2024 for COPD by Rory Percy, MD  Subjective:   PATIENT ID: Darren Rivers GENDER: male DOB: 20-Dec-1950, MRN: [id number]  HPI  Chief Complaint  Patient presents with   Consult    History of COPD. States he was diagnosed about 10 years ago. Increased congestion and SOB over the last few weeks.    Aleksandar Duve is a 31 yea rold male, former smoker with GERD who is referred to pulmonary clinic for COPD.  He reports having chest congestion and cough over the past 5-6 years. He has been on flovent 226mg 2 puffs twice daily which helps him expectorate mucous. He denies wheezing. He has no night time awakenings due to cough. He has exertional dyspnea with walking to his mailbox which is about 4028for going up the stairs in his home. No issues with bathing or getting dressed.   He denies seasonal allergies or sinus congestion with post nasal drainage. He does have GERD which is well controlled on omeprazole.   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 He reports episode of asbestos exposures from re-insulating the pipes in his house. He quit smoking in 2019. He has a 40 pack year smoking history.   Past Medical History:  Diagnosis Date   Arthritis    COPD (chronic obstructive pulmonary disease) (HCC)    GERD (gastroesophageal reflux disease)      No family history on file.   Social History   Socioeconomic History   Marital status: Married    Spouse name: Not on file   Number of children: Not on file   Years of education: Not on file   Highest education level: Not on file  Occupational History   Not on file  Tobacco Use   Smoking status: Former    Packs/day: 1.00    Types: Cigarettes    Quit date: 09/04/2017    Years since quitting: 5.0   Smokeless tobacco: Never  Substance and Sexual Activity   Alcohol use: Not on file    Comment: occasionally    Drug use: No   Sexual activity: Not on file  Other Topics  Concern   Not on file  Social History Narrative   Not on file   Social Determinants of Health   Financial Resource Strain: Not on file  Food Insecurity: Not on file  Transportation Needs: Not on file  Physical Activity: Not on file  Stress: Not on file  Social Connections: Not on file  Intimate Partner Violence: Not on file     No Known Allergies   Outpatient Medications Prior to Visit  Medication Sig Dispense Refill   aspirin EC 81 MG tablet Take 81 mg by mouth daily.     atorvastatin (LIPITOR) 40 MG tablet Take 1 tablet by mouth daily.     dextromethorphan-guaiFENesin (MUCINEX DM) 30-600 MG 12hr tablet Take 1 tablet by mouth 2 (two) times daily.     dorzolamide-timolol (COSOPT) 2-0.5 % ophthalmic solution Place 1 drop into both eyes 2 (two) times daily.     omeprazole (PRILOSEC) 40 MG capsule Take 1 capsule by mouth daily.     tadalafil (CIALIS) 20 MG tablet Take 20 mg by mouth daily.     valACYclovir (VALTREX) 1000 MG tablet Take 1 tablet by mouth 3 (three) times daily.     fluticasone (FLOVENT HFA) 220 MCG/ACT inhaler Inhale 2 puffs into the lungs 2 (two) times daily.  atorvastatin (LIPITOR) 40 MG tablet Take 20 mg by mouth daily.  3   dorzolamide-timolol (COSOPT) 22.3-6.8 MG/ML ophthalmic solution Place 1 drop into both eyes 2 (two) times daily.  99   methocarbamol (ROBAXIN) 500 MG tablet Take 1 tablet (500 mg total) by mouth every 6 (six) hours as needed for muscle spasms. 80 tablet 0   omeprazole (PRILOSEC) 20 MG capsule Take 20 mg by mouth daily.  3   oxyCODONE (OXY IR/ROXICODONE) 5 MG immediate release tablet Take 1-2 tablets (5-10 mg total) by mouth every 3 (three) hours as needed for moderate pain or severe pain. 80 tablet 0   rivaroxaban (XARELTO) 10 MG TABS tablet Take 1 tablet (10 mg total) by mouth daily with breakfast. Take Xarelto for a total of three weeks, then discontinue Xarelto. Once the patient has completed the Xarelto, they may resume the 81 mg Aspirin.  20 tablet 0   traMADol (ULTRAM) 50 MG tablet Take 1-2 tablets (50-100 mg total) by mouth every 6 (six) hours as needed (mild pain). 60 tablet 1   No facility-administered medications prior to visit.   Review of Systems  Constitutional:  Negative for chills, fever, malaise/fatigue and weight loss.  HENT:  Negative for congestion, sinus pain and sore throat.   Eyes: Negative.   Respiratory:  Positive for cough and shortness of breath. Negative for hemoptysis, sputum production and wheezing.   Cardiovascular:  Negative for chest pain, palpitations, orthopnea, claudication and leg swelling.  Gastrointestinal:  Positive for heartburn. Negative for abdominal pain, nausea and vomiting.  Genitourinary: Negative.   Musculoskeletal:  Negative for joint pain and myalgias.  Skin:  Negative for rash.  Neurological:  Negative for weakness.  Endo/Heme/Allergies: Negative.   Psychiatric/Behavioral: Negative.     Objective:   Vitals:   09/13/22 0923  BP: 114/68  Pulse: 85  SpO2: 97%  Weight: 184 lb 3.2 oz (83.6 kg)  Height: 6' (1.829 m)    Physical Exam Constitutional:      General: He is not in acute distress. HENT:     Head: Normocephalic and atraumatic.  Eyes:     Extraocular Movements: Extraocular movements intact.     Conjunctiva/sclera: Conjunctivae normal.     Pupils: Pupils are equal, round, and reactive to light.  Cardiovascular:     Rate and Rhythm: Normal rate and regular rhythm.     Pulses: Normal pulses.     Heart sounds: Normal heart sounds. No murmur heard. Abdominal:     General: Bowel sounds are normal.     Palpations: Abdomen is soft.  Musculoskeletal:     Right lower leg: No edema.     Left lower leg: No edema.  Lymphadenopathy:     Cervical: No cervical adenopathy.  Skin:    General: Skin is warm and dry.  Neurological:     General: No focal deficit present.     Mental Status: He is alert.  Psychiatric:        Mood and Affect: Mood normal.        Behavior:  Behavior normal.        Thought Content: Thought content normal.        Judgment: Judgment normal.    CBC    Component Value Date/Time   WBC 16.5 (H) 06/01/2015 0515   RBC 3.06 (L) 06/01/2015 0515   HGB 10.3 (L) 06/01/2015 0515   HCT 29.7 (L) 06/01/2015 0515   PLT 209 06/01/2015 0515   MCV 97.1 06/01/2015 0515  MCH 33.7 06/01/2015 0515   MCHC 34.7 06/01/2015 0515   RDW 13.2 06/01/2015 0515      Latest Ref Rng & Units 06/01/2015    5:15 AM 05/27/2015   10:05 AM  BMP  Glucose 65 - 99 mg/dL 158  88   BUN 6 - 20 mg/dL 12  9   Creatinine 0.61 - 1.24 mg/dL 0.87  0.94   Sodium 135 - 145 mmol/L 136  140   Potassium 3.5 - 5.1 mmol/L 4.5  4.7   Chloride 101 - 111 mmol/L 107  107   CO2 22 - 32 mmol/L 22  25   Calcium 8.9 - 10.3 mg/dL 8.3  9.6    Chest imaging: CT Chest 08/30/18 1. Scattered small solid pulmonary nodules, all stable since 2013 chest CT, considered benign. 2. Mild centrilobular and paraseptal emphysema with mild diffuse bronchial wall thickening, compatible with the provided history of COPD. 3. Two-vessel coronary atherosclerosis. 4. Nonobstructing left nephrolithiasis. 5. Small hiatal hernia.  PFT:     No data to display          Labs:  Path:  Echo:  Heart Catheterization:  Assessment & Plan:   Chronic bronchitis, unspecified chronic bronchitis type ([place]) - Plan: fluticasone-salmeterol (ADVAIR HFA) 230-21 MCG/ACT inhaler, Pulmonary Function Test  Former smoker - Plan: Ambulatory Referral for Lung Cancer Scre  Discussion: Anil Havard is a 60 yea rold male, former smoker with GERD who is referred to pulmonary clinic for COPD.  He appears to have chronic bronchitis based on his clinical history with cough and congestion.   He is to start advair HFA 230-36mg 2 puffs twice daily and stop flovent inhaler.   We will refer him to our lung cancer screening team based on his 40 pack year smoking history.   Follow up in 2 months with pulmonary  function testing.  JFreda Jackson MD [facility] Office: [phone]  Current Outpatient Medications:    aspirin EC 81 MG tablet, Take 81 mg by mouth daily., Disp: , Rfl:    atorvastatin (LIPITOR) 40 MG tablet, Take 1 tablet by mouth daily., Disp: , Rfl:    dextromethorphan-guaiFENesin (MUCINEX DM) 30-600 MG 12hr tablet, Take 1 tablet by mouth 2 (two) times daily., Disp: , Rfl:    dorzolamide-timolol (COSOPT) 2-0.5 % ophthalmic solution, Place 1 drop into both eyes 2 (two) times daily., Disp: , Rfl:    fluticasone-salmeterol (ADVAIR HFA) 230-21 MCG/ACT inhaler, Inhale 2 puffs into the lungs 2 (two) times daily., Disp: 1 each, Rfl: 12   omeprazole (PRILOSEC) 40 MG capsule, Take 1 capsule by mouth daily., Disp: , Rfl:    tadalafil (CIALIS) 20 MG tablet, Take 20 mg by mouth daily., Disp: , Rfl:    valACYclovir (VALTREX) 1000 MG tablet, Take 1 tablet by mouth 3 (three) times daily., Disp: , Rfl:

## 2022-09-26 ENCOUNTER — Telehealth: Payer: Self-pay | Admitting: Pulmonary Disease

## 2022-09-26 NOTE — Telephone Encounter (Signed)
Pt was prescribed some  Flutisasone and it is not on his Ins. List. Can we get him another type that will be covered?  Pls call @ [phone]  Pharm: [facility] on [address] in [place]

## 2022-09-28 ENCOUNTER — Other Ambulatory Visit (HOSPITAL_COMMUNITY): Payer: Self-pay

## 2022-09-28 NOTE — Telephone Encounter (Signed)
Test claim shows that Advair HFA is covered, but refill too soon until 02/03 due to last fill of 01/11.

## 2022-09-28 NOTE — Telephone Encounter (Signed)
Rx that pt was prescribed was Advair HFA.   Routing to prior auth team to see if they can tell us what is covered by pt's insurance.

## 2022-09-28 NOTE — Telephone Encounter (Signed)
Called and spoke with pt letting him know info per prior auth team and he verbalized understanding. Nothing further needed.

## 2022-10-11 DIAGNOSIS — H401331 Pigmentary glaucoma, bilateral, mild stage: Secondary | ICD-10-CM | POA: Diagnosis not present

## 2022-10-17 DIAGNOSIS — N400 Enlarged prostate without lower urinary tract symptoms: Secondary | ICD-10-CM | POA: Diagnosis not present

## 2022-10-17 DIAGNOSIS — Z720 Tobacco use: Secondary | ICD-10-CM | POA: Diagnosis not present

## 2022-10-17 DIAGNOSIS — E7801 Familial hypercholesterolemia: Secondary | ICD-10-CM | POA: Diagnosis not present

## 2022-10-17 DIAGNOSIS — R739 Hyperglycemia, unspecified: Secondary | ICD-10-CM | POA: Diagnosis not present

## 2022-10-17 DIAGNOSIS — J449 Chronic obstructive pulmonary disease, unspecified: Secondary | ICD-10-CM | POA: Diagnosis not present

## 2022-10-17 DIAGNOSIS — K219 Gastro-esophageal reflux disease without esophagitis: Secondary | ICD-10-CM | POA: Diagnosis not present

## 2022-10-17 DIAGNOSIS — E7849 Other hyperlipidemia: Secondary | ICD-10-CM | POA: Diagnosis not present

## 2022-10-17 DIAGNOSIS — E039 Hypothyroidism, unspecified: Secondary | ICD-10-CM | POA: Diagnosis not present

## 2022-10-20 DIAGNOSIS — R030 Elevated blood-pressure reading, without diagnosis of hypertension: Secondary | ICD-10-CM | POA: Diagnosis not present

## 2022-10-20 DIAGNOSIS — K219 Gastro-esophageal reflux disease without esophagitis: Secondary | ICD-10-CM | POA: Diagnosis not present

## 2022-10-20 DIAGNOSIS — Z0000 Encounter for general adult medical examination without abnormal findings: Secondary | ICD-10-CM | POA: Diagnosis not present

## 2022-10-20 DIAGNOSIS — E7849 Other hyperlipidemia: Secondary | ICD-10-CM | POA: Diagnosis not present

## 2022-10-20 DIAGNOSIS — D649 Anemia, unspecified: Secondary | ICD-10-CM | POA: Diagnosis not present

## 2022-10-20 DIAGNOSIS — Z6826 Body mass index (BMI) 26.0-26.9, adult: Secondary | ICD-10-CM | POA: Diagnosis not present

## 2022-10-20 DIAGNOSIS — E782 Mixed hyperlipidemia: Secondary | ICD-10-CM | POA: Diagnosis not present

## 2022-10-20 DIAGNOSIS — E875 Hyperkalemia: Secondary | ICD-10-CM | POA: Diagnosis not present

## 2022-10-20 DIAGNOSIS — Z0001 Encounter for general adult medical examination with abnormal findings: Secondary | ICD-10-CM | POA: Diagnosis not present

## 2022-10-27 ENCOUNTER — Other Ambulatory Visit: Payer: Self-pay

## 2022-10-27 DIAGNOSIS — Z87891 Personal history of nicotine dependence: Secondary | ICD-10-CM

## 2022-10-27 DIAGNOSIS — Z122 Encounter for screening for malignant neoplasm of respiratory organs: Secondary | ICD-10-CM

## 2022-11-14 ENCOUNTER — Ambulatory Visit (INDEPENDENT_AMBULATORY_CARE_PROVIDER_SITE_OTHER): Payer: Medicare Other | Admitting: Pulmonary Disease

## 2022-11-14 ENCOUNTER — Encounter: Payer: Self-pay | Admitting: Pulmonary Disease

## 2022-11-14 DIAGNOSIS — Z122 Encounter for screening for malignant neoplasm of respiratory organs: Secondary | ICD-10-CM

## 2022-11-14 DIAGNOSIS — F17211 Nicotine dependence, cigarettes, in remission: Secondary | ICD-10-CM | POA: Diagnosis not present

## 2022-11-14 DIAGNOSIS — Z87891 Personal history of nicotine dependence: Secondary | ICD-10-CM

## 2022-11-14 NOTE — Patient Instructions (Signed)
Thank you for participating in the [facility]. It was our pleasure to meet you today. We will call you with the results of your scan within the next few days. Your scan will be assigned a Lung RADS category score by the physicians reading the scans.  This Lung RADS score determines follow up scanning.  See below for description of categories, and follow up screening recommendations. We will be in touch to schedule your follow up screening annually or based on recommendations of our providers. We will fax a copy of your scan results to your Primary Care Physician, or the physician who referred you to the program, to ensure they have the results. Please call the office if you have any questions or concerns regarding your scanning experience or results.  Our office number is [phone]. Please speak with Denise Phelps, RN. , or  Denise Buckner RN, They are  our Lung Cancer Screening RN.'s If They are unavailable when you call, Please leave a message on the voice mail. We will return your call at our earliest convenience.This voice mail is monitored several times a day.  Remember, if your scan is normal, we will scan you annually as long as you continue to meet the criteria for the program. (Age 50-80, Current smoker or smoker who has quit within the last 15 years). If you are a smoker, remember, quitting is the single most powerful action that you can take to decrease your risk of lung cancer and other pulmonary, breathing related problems. We know quitting is hard, and we are here to help.  Please let us know if there is anything we can do to help you meet your goal of quitting. If you are a former smoker, congratulations. We are proud of you! Remain smoke free! Remember you can refer friends or family members through the number above.  We will screen them to make sure they meet criteria for the program. Thank you for helping us take better care of you by  participating in Lung Screening.  You can receive free nicotine replacement therapy ( patches, gum or mints) by calling 1-800-QUIT NOW. Please call so we can get you on the path to becoming  a non-smoker. I know it is hard, but you can do this!  Lung RADS Categories:  Lung RADS 1: no nodules or definitely non-concerning nodules.  Recommendation is for a repeat annual scan in 12 months.  Lung RADS 2:  nodules that are non-concerning in appearance and behavior with a very low likelihood of becoming an active cancer. Recommendation is for a repeat annual scan in 12 months.  Lung RADS 3: nodules that are probably non-concerning , includes nodules with a low likelihood of becoming an active cancer.  Recommendation is for a 6-month repeat screening scan. Often noted after an upper respiratory illness. We will be in touch to make sure you have no questions, and to schedule your 6-month scan.  Lung RADS 4 A: nodules with concerning findings, recommendation is most often for a follow up scan in 3 months or additional testing based on our provider's assessment of the scan. We will be in touch to make sure you have no questions and to schedule the recommended 3 month follow up scan.  Lung RADS 4 B:  indicates findings that are concerning. We will be in touch with you to schedule additional diagnostic testing based on our provider's  assessment of the scan.  Other options for assistance in smoking cessation (   As covered by your insurance benefits)  Hypnosis for smoking cessation  [employer] [phone]  Acupuncture for smoking cessation  [facility] [phone]   

## 2022-11-14 NOTE — Progress Notes (Signed)
  Virtual Visit via Telephone Note  I connected with Darren Rivers on 11/14/22 at 11:00 AM EDT by telephone and verified that I am speaking with the correct person using two identifiers.  Location: Patient: HOME Provider: [place] [id number]    Shared Decision Making Visit Lung Cancer Screening Program [phone])   Eligibility: Age 72 y.o. Pack Years Smoking History Calculation 50 (# packs/per year x # years smoked) Recent History of coughing up blood  no Unexplained weight loss? no ( >Than 15 pounds within the last 6 months ) Prior History Lung / other cancer no (Diagnosis within the last 5 years already requiring surveillance chest CT Scans). Smoking Status Former Smoker Former Smokers: Years since quit: 5 years  Quit Date: 2019  Visit Components: Discussion included one or more decision making aids. yes Discussion included risk/benefits of screening. yes Discussion included potential follow up diagnostic testing for abnormal scans. yes Discussion included meaning and risk of over diagnosis. yes Discussion included meaning and risk of False Positives. yes Discussion included meaning of total radiation exposure. yes  Counseling Included: Importance of adherence to annual lung cancer LDCT screening. yes Impact of comorbidities on ability to participate in the program. yes Ability and willingness to under diagnostic treatment. yes  Smoking Cessation Counseling:  Former Smokers:  Discussed the importance of maintaining cigarette abstinence. yes Diagnosis Code: Personal History of Nicotine Dependence. H70.263 Information about tobacco cessation classes and interventions provided to patient. Yes Patient provided with "ticket" for LDCT Scan. yes Written Order for Lung Cancer Screening with LDCT placed in Epic. Yes (CT Chest Lung Cancer Screening Low Dose W/O CM) [id number] Z12.2-Screening of respiratory organs Z87.891-Personal history of nicotine  dependence   Lauraine Rinne, NP

## 2022-11-15 ENCOUNTER — Ambulatory Visit
Admission: RE | Admit: 2022-11-15 | Discharge: 2022-11-15 | Disposition: A | Discharge status: Home / Self Care | Payer: Medicare Other | Source: Ambulatory Visit | Attending: Family Medicine | Admitting: Family Medicine

## 2022-11-15 DIAGNOSIS — Z122 Encounter for screening for malignant neoplasm of respiratory organs: Secondary | ICD-10-CM

## 2022-11-15 DIAGNOSIS — Z87891 Personal history of nicotine dependence: Secondary | ICD-10-CM

## 2022-11-19 ENCOUNTER — Other Ambulatory Visit: Payer: Self-pay | Admitting: Acute Care

## 2022-11-19 DIAGNOSIS — Z122 Encounter for screening for malignant neoplasm of respiratory organs: Secondary | ICD-10-CM

## 2022-11-19 DIAGNOSIS — Z87891 Personal history of nicotine dependence: Secondary | ICD-10-CM

## 2023-01-05 DIAGNOSIS — G5601 Carpal tunnel syndrome, right upper limb: Secondary | ICD-10-CM | POA: Diagnosis not present

## 2023-01-05 DIAGNOSIS — M65332 Trigger finger, left middle finger: Secondary | ICD-10-CM | POA: Diagnosis not present

## 2023-02-02 DIAGNOSIS — M65332 Trigger finger, left middle finger: Secondary | ICD-10-CM | POA: Diagnosis not present

## 2023-02-02 DIAGNOSIS — G5601 Carpal tunnel syndrome, right upper limb: Secondary | ICD-10-CM | POA: Diagnosis not present

## 2023-02-06 ENCOUNTER — Encounter: Payer: Self-pay | Admitting: Primary Care

## 2023-02-06 ENCOUNTER — Ambulatory Visit (INDEPENDENT_AMBULATORY_CARE_PROVIDER_SITE_OTHER): Payer: Medicare Other | Admitting: Primary Care

## 2023-02-06 ENCOUNTER — Ambulatory Visit (INDEPENDENT_AMBULATORY_CARE_PROVIDER_SITE_OTHER): Payer: Medicare Other | Admitting: Pulmonary Disease

## 2023-02-06 VITALS — BP 116/64 | HR 51 | Temp 97.8°F | Ht 72.0 in | Wt 180.0 lb

## 2023-02-06 DIAGNOSIS — K219 Gastro-esophageal reflux disease without esophagitis: Secondary | ICD-10-CM | POA: Diagnosis not present

## 2023-02-06 DIAGNOSIS — J310 Chronic rhinitis: Secondary | ICD-10-CM | POA: Diagnosis not present

## 2023-02-06 DIAGNOSIS — J42 Unspecified chronic bronchitis: Secondary | ICD-10-CM | POA: Diagnosis not present

## 2023-02-06 DIAGNOSIS — J449 Chronic obstructive pulmonary disease, unspecified: Secondary | ICD-10-CM | POA: Insufficient documentation

## 2023-02-06 LAB — PULMONARY FUNCTION TEST
DL/VA % pred: 60 %
DL/VA: 2.45 ml/min/mmHg/L
DLCO cor % pred: 66 %
DLCO cor: 18.37 ml/min/mmHg
DLCO unc % pred: 66 %
DLCO unc: 18.37 ml/min/mmHg
FEF 25-75 Post: 2.48 L/sec
FEF 25-75 Pre: 2.43 L/sec
FEF2575-%Change-Post: 2 %
FEF2575-%Pred-Post: 93 %
FEF2575-%Pred-Pre: 91 %
FEV1-%Change-Post: 0 %
FEV1-%Pred-Post: 104 %
FEV1-%Pred-Pre: 103 %
FEV1-Post: 3.66 L
FEV1-Pre: 3.64 L
FEV1FVC-%Change-Post: 0 %
FEV1FVC-%Pred-Pre: 93 %
FEV6-%Change-Post: 0 %
FEV6-%Pred-Post: 114 %
FEV6-%Pred-Pre: 115 %
FEV6-Post: 5.18 L
FEV6-Pre: 5.2 L
FEV6FVC-%Change-Post: -1 %
FEV6FVC-%Pred-Post: 102 %
FEV6FVC-%Pred-Pre: 103 %
FVC-%Change-Post: 0 %
FVC-%Pred-Post: 111 %
FVC-%Pred-Pre: 110 %
FVC-Post: 5.34 L
FVC-Pre: 5.29 L
Post FEV1/FVC ratio: 69 %
Post FEV6/FVC ratio: 97 %
Pre FEV1/FVC ratio: 69 %
Pre FEV6/FVC Ratio: 98 %
RV % pred: 100 %
RV: 2.57 L
TLC % pred: 109 %
TLC: 8.14 L

## 2023-02-06 MED ORDER — SPIRIVA RESPIMAT 2.5 MCG/ACT IN AERS: 2.0000 | INHALATION_SPRAY | Freq: Every day | RESPIRATORY_TRACT | 0 refills | Status: DC

## 2023-02-06 MED ORDER — FLUTICASONE PROPIONATE 50 MCG/ACT NA SUSP: 1.0000 | Freq: Every day | NASAL | 2 refills | Status: DC

## 2023-02-06 NOTE — Assessment & Plan Note (Addendum)
-   Patient has chronic chest congestion with a productive cough. No associated shortness of breath or wheezing. Pulmonary function testing showed very mild obstructive lung disease with moderate diffusion defect, chronic bronchitis type. No reversibility. 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 He has not noticed any clinical benefit from Advair. Recommending changing maintenance inhaler to Spiriva Respimat 2.50mcg two puffs daily. Advised he take mucinex 600mg  twice daily and use flutter valve three times a day consistently for 2 weeks. If symptoms worsen off Advair, advised he notify office and resume. FU in 3 months or sooner.

## 2023-02-06 NOTE — Progress Notes (Signed)
@Patient  ID: Darren Rivers, male    DOB: 20-Jun-1951, 72 y.o.   MRN: [id number]  Chief Complaint  Patient presents with   Follow-up    Review PFT today.  Cough and congestion persistent.    Referring provider: Selinda Flavin, MD  HPI: 72 year old male, former smoker.  Past medical history significant for kidney stone, benign prostatic hyperplasia, chronic bronchitis.  02/06/2023 Patient presents today for pulmonary function testin. He is doing well today without acute complaints. PFTs today showed very mild obstructive airway disease without reversibility. Moderate diffusion defect. Chest congestion is his main issue. Cough is productive. Associated PND/nasal congestion. He has not tried any over the counter nasal sprays. He will occasionally take mucinex -dm which seems to helps. He has a flutter valve but is not currently using. He has not noticed difference on Advair. Denies active shortness of breath or wheezing.   Pulmonary function testing FVC 5.34 (111%), FEV1 3.66 (104%), ratio 69, TLC 109%, DLCO 66%  No Known Allergies  Immunization History  Administered Date(s) Administered   Covid-19, Mrna,Vaccine(Spikevax)20yrs and older 06/07/2022   Moderna Covid-19 Vaccine Bivalent Booster 109yrs & up 06/04/2021   Moderna Sars-Covid-2 Vaccination 11/01/2019, 11/29/2019, 07/08/2020, 01/05/2021    Past Medical History:  Diagnosis Date   Arthritis    COPD (chronic obstructive pulmonary disease) (HCC)    GERD (gastroesophageal reflux disease)     Tobacco History: Social History   Tobacco Use  Smoking Status Former   Packs/day: 1   Types: Cigarettes   Quit date: 09/04/2017   Years since quitting: 5.4   Passive exposure: Past  Smokeless Tobacco Never   Counseling given: Not Answered   Outpatient Medications Prior to Visit  Medication Sig Dispense Refill   aspirin EC 81 MG tablet Take 81 mg by mouth every other day.     atorvastatin (LIPITOR) 40 MG tablet Take 1 tablet by  mouth daily.     dextromethorphan-guaiFENesin (MUCINEX DM) 30-600 MG 12hr tablet Take 1 tablet by mouth 2 (two) times daily as needed for cough.     dorzolamide-timolol (COSOPT) 2-0.5 % ophthalmic solution Place 1 drop into both eyes 2 (two) times daily.     fluticasone-salmeterol (ADVAIR HFA) 230-21 MCG/ACT inhaler Inhale 2 puffs into the lungs 2 (two) times daily. 1 each 12   omeprazole (PRILOSEC) 40 MG capsule Take 1 capsule by mouth daily.     tadalafil (CIALIS) 20 MG tablet Take 20 mg by mouth daily.     valACYclovir (VALTREX) 1000 MG tablet Take 1 tablet by mouth 3 (three) times daily.     No facility-administered medications prior to visit.   Review of Systems  Review of Systems  HENT: Negative.    Respiratory:  Positive for cough. Negative for shortness of breath and wheezing.     Physical Exam  BP 116/64 (BP Location: Left Arm, Patient Position: Sitting, Cuff Size: Normal)   Pulse (!) 51   Temp 97.8 F (36.6 C) (Oral)   Ht 6' (1.829 m)   Wt 180 lb (81.6 kg)   SpO2 98%   BMI 24.41 kg/m  Physical Exam Constitutional:      Appearance: Normal appearance.  HENT:     Head: Normocephalic and atraumatic.     Mouth/Throat:     Mouth: Mucous membranes are moist.     Pharynx: Oropharynx is clear.  Cardiovascular:     Rate and Rhythm: Normal rate and regular rhythm.  Pulmonary:     Effort: Pulmonary effort is  normal.     Breath sounds: Normal breath sounds.  Musculoskeletal:        General: Normal range of motion.  Skin:    General: Skin is warm and dry.  Neurological:     General: No focal deficit present.     Mental Status: He is alert and oriented to person, place, and time. Mental status is at baseline.  Psychiatric:        Mood and Affect: Mood normal.        Behavior: Behavior normal.        Thought Content: Thought content normal.        Judgment: Judgment normal.      Lab Results:  CBC    Component Value Date/Time   WBC 16.5 (H) 06/01/2015 0515   RBC  3.06 (L) 06/01/2015 0515   HGB 10.3 (L) 06/01/2015 0515   HCT 29.7 (L) 06/01/2015 0515   PLT 209 06/01/2015 0515   MCV 97.1 06/01/2015 0515   MCH 33.7 06/01/2015 0515   MCHC 34.7 06/01/2015 0515   RDW 13.2 06/01/2015 0515    BMET    Component Value Date/Time   NA 136 06/01/2015 0515   K 4.5 06/01/2015 0515   CL 107 06/01/2015 0515   CO2 22 06/01/2015 0515   GLUCOSE 158 (H) 06/01/2015 0515   BUN 12 06/01/2015 0515   CREATININE 0.87 06/01/2015 0515   CALCIUM 8.3 (L) 06/01/2015 0515   GFRNONAA >60 06/01/2015 0515   GFRAA >60 06/01/2015 0515    BNP No results found for: "BNP"  ProBNP No results found for: "PROBNP"  Imaging: No results found.   Assessment & Plan:   Stage 1 mild COPD by GOLD classification (HCC) - Patient has chronic chest congestion with a productive cough. No associated shortness of breath or wheezing. Pulmonary function testing showed very mild obstructive lung disease with moderate diffusion defect, chronic bronchitis type. No reversibility. 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 He has not noticed any clinical benefit from Advair. Recommending changing maintenance inhaler to Spiriva Respimat 2.59mcg two puffs daily. Advised he take mucinex 600mg  twice daily and use flutter valve three times a day consistently for 2 weeks. If symptoms worsen off Advair, advised he notify office and resume. FU in 3 months or sooner.   Rhinitis - Start fluticasone nasal spray 1 puff per nostril daily   GERD (gastroesophageal reflux disease) - Continue prilosec 40mg  daily    Glenford Bayley, NP 02/06/2023

## 2023-02-06 NOTE — Assessment & Plan Note (Signed)
Continue prilosec 40mg daily.  

## 2023-02-06 NOTE — Patient Instructions (Addendum)
Pulmonary function testing showed very mild obstructive lung disease or COPD chronic bronchitis type No evidence of asthma on testing Recommending changing maintenance inhaler to Spiriva Respimat   Recommendations: - Stop Advair - Start Spiriva  Respimat 2 puffs daily every morning x 2 week (if beneficial notify office by mychart or phone and we will send a prescription. If respiratory symptoms worsen off Advair please resume and notify office) - Take Mucinex 600 mg twice daily for 2 weeks - Use flutter valve 3 times daily for 2 weeks - Start nasal spray as directed    Follow-up: - 3 months with Dr. Francine Rivers or sooner if needed   Chronic Obstructive Pulmonary Disease  Chronic obstructive pulmonary disease (COPD) is a long-term (chronic) lung problem. When you have COPD, it is hard for air to get in and out of your lungs. Usually the condition gets worse over time, and your lungs will never return to normal. There are things you can do to keep yourself as healthy as possible. What are the causes? Smoking. This is the most common cause. Certain genes passed from parent to child (inherited). What increases the risk? Being exposed to secondhand smoke from cigarettes, pipes, or cigars. Being exposed to chemicals and other irritants, such as fumes and dust in the work environment. Having chronic lung conditions or infections. What are the signs or symptoms? Shortness of breath, especially during physical activity. A long-term cough with a large amount of thick mucus. Sometimes, the cough may not have any mucus (dry cough). Wheezing. Breathing quickly. Skin that looks gray or blue, especially in the fingers, toes, or lips. Feeling tired (fatigue). Weight loss. Chest tightness. Having infections often. Episodes when breathing symptoms become much worse (exacerbations). At the later stages of this disease, you may have swelling in the ankles, feet, or legs. How is this treated? Taking  medicines. Quitting smoking, if you smoke. Rehabilitation. This includes steps to make your body work better. It may involve a team of specialists. Doing exercises. Making changes to your diet. Using oxygen. Lung surgery. Lung transplant. Comfort measures (palliative care). Follow these instructions at home: Medicines Take over-the-counter and prescription medicines only as told by your doctor. Talk to your doctor before taking any cough or allergy medicines. You may need to avoid medicines that cause your lungs to be dry. Lifestyle If you smoke, stop smoking. Smoking makes the problem worse. Do not smoke or use any products that contain nicotine or tobacco. If you need help quitting, ask your doctor. Avoid being around things that make your breathing worse. This may include smoke, chemicals, and fumes. Stay active, but remember to rest as well. Learn and use tips on how to manage stress and control your breathing. Make sure you get enough sleep. Most adults need at least 7 hours of sleep every night. Eat healthy foods. Eat smaller meals more often. Rest before meals. Controlled breathing Learn and use tips on how to control your breathing as told by your doctor. Try: Breathing in (inhaling) through your nose for 1 second. Then, pucker your lips and breath out (exhale) through your lips for 2 seconds. Putting one hand on your belly (abdomen). Breathe in slowly through your nose for 1 second. Your hand on your belly should move out. Pucker your lips and breathe out slowly through your lips. Your hand on your belly should move in as you breathe out.  Controlled coughing Learn and use controlled coughing to clear mucus from your lungs. Follow these steps:  Lean your head a little forward. Breathe in deeply. Try to hold your breath for 3 seconds. Keep your mouth slightly open while coughing 2 times. Spit any mucus out into a tissue. Rest and do the steps again 1 or 2 times as  needed. General instructions Make sure you get all the shots (vaccines) that your doctor recommends. Ask your doctor about a flu shot and a pneumonia shot. Use oxygen therapy and pulmonary rehabilitation if told by your doctor. If you need home oxygen therapy, ask your doctor if you should buy a tool to measure your oxygen level (oximeter). Make a COPD action plan with your doctor. This helps you to know what to do if you feel worse than usual. Manage any other conditions you have as told by your doctor. Avoid going outside when it is very hot, cold, or humid. Avoid people who have a sickness you can catch (contagious). Keep all follow-up visits. Contact a doctor if: You cough up more mucus than usual. There is a change in the color or thickness of the mucus. It is harder to breathe than usual. Your breathing is faster than usual. You have trouble sleeping. You need to use your medicines more often than usual. You have trouble doing your normal activities such as getting dressed or walking around the house. Get help right away if: You have shortness of breath while resting. You have shortness of breath that stops you from: Being able to talk. Doing normal activities. Your chest hurts for longer than 5 minutes. Your skin color is more blue than usual. Your pulse oximeter shows that you have low oxygen for longer than 5 minutes. You have a fever. You feel too tired to breathe normally. These symptoms may represent a serious problem that is an emergency. Do not wait to see if the symptoms will go away. Get medical help right away. Call your local emergency services (911 in the [place] Do not drive yourself to the hospital. Summary Chronic obstructive pulmonary disease (COPD) is a long-term lung problem. The way your lungs work will never return to normal. Usually the condition gets worse over time. There are things you can do to keep yourself as healthy as possible. Take over-the-counter  and prescription medicines only as told by your doctor. If you smoke, stop. Smoking makes the problem worse. This information is not intended to replace advice given to you by your health care provider. Make sure you discuss any questions you have with your health care provider. Document Revised: 06/28/2020 Document Reviewed: 06/29/2020 Elsevier Patient Education  2024 [employer].

## 2023-02-06 NOTE — Patient Instructions (Signed)
Full PFT performed today. °

## 2023-02-06 NOTE — Progress Notes (Signed)
Full PFT performed today. °

## 2023-02-06 NOTE — Assessment & Plan Note (Signed)
-   Start fluticasone nasal spray 1 puff per nostril daily °

## 2023-02-22 ENCOUNTER — Telehealth: Payer: Self-pay | Admitting: Primary Care

## 2023-02-22 ENCOUNTER — Other Ambulatory Visit: Payer: Self-pay

## 2023-02-22 MED ORDER — SPIRIVA RESPIMAT 2.5 MCG/ACT IN AERS: 2.0000 | INHALATION_SPRAY | Freq: Every day | RESPIRATORY_TRACT | 6 refills | Status: DC

## 2023-02-22 NOTE — Telephone Encounter (Signed)
Spoke with patient he states Spiriva is working well for him and wants a prescription sent to his pharmacy. Script has been sent.  Beth just an FYI 

## 2023-02-22 NOTE — Telephone Encounter (Signed)
Pt calling was given samples last time in office now needs script to go to pharmacy for Tiotropium Bromide Monohydrate (SPIRIVA RESPIMAT) 2.5 MCG/ACT AERS [[id number]] the med is working well for him

## 2023-02-22 NOTE — Telephone Encounter (Signed)
Spoke with patient he states Spiriva is working well for him and wants a prescription sent to his pharmacy. Script has been sent.  Beth just an [place]

## 2023-02-26 DIAGNOSIS — G5601 Carpal tunnel syndrome, right upper limb: Secondary | ICD-10-CM | POA: Diagnosis not present

## 2023-03-13 DIAGNOSIS — M65332 Trigger finger, left middle finger: Secondary | ICD-10-CM | POA: Diagnosis not present

## 2023-03-13 DIAGNOSIS — G5601 Carpal tunnel syndrome, right upper limb: Secondary | ICD-10-CM | POA: Diagnosis not present

## 2023-03-27 ENCOUNTER — Ambulatory Visit: Payer: Medicare Other | Admitting: Adult Health

## 2023-03-30 DIAGNOSIS — G5601 Carpal tunnel syndrome, right upper limb: Secondary | ICD-10-CM | POA: Diagnosis not present

## 2023-03-30 DIAGNOSIS — M65311 Trigger thumb, right thumb: Secondary | ICD-10-CM | POA: Diagnosis not present

## 2023-04-27 DIAGNOSIS — H401331 Pigmentary glaucoma, bilateral, mild stage: Secondary | ICD-10-CM | POA: Diagnosis not present

## 2023-05-25 DIAGNOSIS — Z23 Encounter for immunization: Secondary | ICD-10-CM | POA: Diagnosis not present

## 2023-05-28 ENCOUNTER — Encounter: Payer: Self-pay | Admitting: Pulmonary Disease

## 2023-05-28 ENCOUNTER — Ambulatory Visit (INDEPENDENT_AMBULATORY_CARE_PROVIDER_SITE_OTHER): Payer: Medicare Other | Admitting: Pulmonary Disease

## 2023-05-28 VITALS — BP 108/60 | HR 62 | Ht 72.0 in | Wt 178.0 lb

## 2023-05-28 DIAGNOSIS — J449 Chronic obstructive pulmonary disease, unspecified: Secondary | ICD-10-CM | POA: Diagnosis not present

## 2023-05-28 DIAGNOSIS — J42 Unspecified chronic bronchitis: Secondary | ICD-10-CM

## 2023-05-28 MED ORDER — AZITHROMYCIN 250 MG PO TABS
ORAL_TABLET | ORAL | 0 refills | Status: AC
Start: 2023-05-28 — End: ?

## 2023-05-28 MED ORDER — PREDNISONE 10 MG PO TABS
ORAL_TABLET | ORAL | 0 refills | Status: AC
Start: 2023-05-28 — End: 2023-06-08

## 2023-05-28 MED ORDER — IPRATROPIUM-ALBUTEROL 0.5-2.5 (3) MG/3ML IN SOLN
3.0000 mL | Freq: Two times a day (BID) | RESPIRATORY_TRACT | 5 refills | Status: DC
Start: 2023-05-28 — End: 2024-06-01

## 2023-05-28 NOTE — Patient Instructions (Addendum)
We will order you a nebulizer machine and duoneb nebulizer solution to use twice daily followed by flutter valve therapy to clear out the mucous in the lungs.   Start prednisone taper  Start Zpak antibiotic  Continue spiriva 2 puffs daily  Follow up in 6 months

## 2023-05-28 NOTE — Progress Notes (Signed)
Synopsis: Referred in January 2024 for COPD by Selinda Flavin, MD  Subjective:   PATIENT ID: Darren Rivers GENDER: male DOB: 03-Jul-1951, MRN: [id number]  HPI  Chief Complaint  Patient presents with   Follow-up    Breathing is unchanged since his last visit. Denies any new co's.    Darren Rivers is a 30 yea rold male, former smoker with GERD who returns to pulmonary clinic for COPD.  Initial OV 09/13/22 He reports having chest congestion and cough over the past 5-6 years. He has been on flovent 2 puffs twice daily which helps him expectorate mucous. He denies wheezing. He has no night time awakenings due to cough. He has exertional dyspnea with walking to his mailbox which is about 414ft or going up the stairs in his home. No issues with bathing or getting dressed.   He denies seasonal allergies or sinus congestion with post nasal drainage. He does have GERD which is well controlled on omeprazole.   He is retired Production designer, theatre/television/film. Reports exposure to card board dusts. He reports episode of asbestos exposures from re-insulating the pipes in his house. He quit smoking in 2019. He has a 40 pack year smoking history.   OV today 05/28/23 He was seen by Buelah Manis, NP 02/06/23 switched from advair to spiriva respimat 2.61mcg 2 puffs daily at last visit.   He continues to have hacking cough and chest congestion in the morning for a couple of hours. May have episode in the afternoons where it will flare up.    Past Medical History:  Diagnosis Date   Arthritis    COPD (chronic obstructive pulmonary disease) (HCC)    GERD (gastroesophageal reflux disease)      History reviewed. No pertinent family history.   Social History   Socioeconomic History   Marital status: Married    Spouse name: Not on file   Number of children: Not on file   Years of education: Not on file   Highest education level: Not on file  Occupational History   Not on file  Tobacco Use   Smoking status:  Former    Current packs/day: 0.00    Types: Cigarettes    Quit date: 09/04/2017    Years since quitting: 5.7    Passive exposure: Past   Smokeless tobacco: Never  Substance and Sexual Activity   Alcohol use: Not on file    Comment: occasionally    Drug use: No   Sexual activity: Not on file  Other Topics Concern   Not on file  Social History Narrative   Not on file   Social Determinants of Health   Financial Resource Strain: Not on file  Food Insecurity: Not on file  Transportation Needs: Not on file  Physical Activity: Not on file  Stress: Not on file  Social Connections: Not on file  Intimate Partner Violence: Not on file     No Known Allergies   Outpatient Medications Prior to Visit  Medication Sig Dispense Refill   aspirin EC 81 MG tablet Take 81 mg by mouth every other day.     atorvastatin (LIPITOR) 40 MG tablet Take 1 tablet by mouth daily.     dextromethorphan-guaiFENesin (MUCINEX DM) 30-600 MG 12hr tablet Take 1 tablet by mouth 2 (two) times daily as needed for cough.     dorzolamide-timolol (COSOPT) 2-0.5 % ophthalmic solution Place 1 drop into both eyes 2 (two) times daily.     omeprazole (PRILOSEC) 40 MG  capsule Take 1 capsule by mouth daily.     tadalafil (CIALIS) 20 MG tablet Take 20 mg by mouth daily.     Tiotropium Bromide Monohydrate (SPIRIVA RESPIMAT) 2.5 MCG/ACT AERS Inhale 2 puffs into the lungs daily. 4 g 6   valACYclovir (VALTREX) 1000 MG tablet Take 1 tablet by mouth 3 (three) times daily.     fluticasone (FLONASE) 50 MCG/ACT nasal spray Place 1 spray into both nostrils daily. 16 g 2   No facility-administered medications prior to visit.   Review of Systems  Constitutional:  Negative for chills, fever, malaise/fatigue and weight loss.  HENT:  Negative for congestion, sinus pain and sore throat.   Eyes: Negative.   Respiratory:  Positive for cough and shortness of breath. Negative for hemoptysis, sputum production and wheezing.   Cardiovascular:   Negative for chest pain, palpitations, orthopnea, claudication and leg swelling.  Gastrointestinal:  Positive for heartburn. Negative for abdominal pain, nausea and vomiting.  Genitourinary: Negative.   Musculoskeletal:  Negative for joint pain and myalgias.  Skin:  Negative for rash.  Neurological:  Negative for weakness.  Endo/Heme/Allergies: Negative.   Psychiatric/Behavioral: Negative.     Objective:   Vitals:   05/28/23 0841  BP: 108/60  Pulse: 62  SpO2: 100%  Weight: 178 lb (80.7 kg)  Height: 6' (1.829 m)    Physical Exam Constitutional:      General: He is not in acute distress. HENT:     Head: Normocephalic and atraumatic.  Eyes:     Conjunctiva/sclera: Conjunctivae normal.  Cardiovascular:     Rate and Rhythm: Normal rate and regular rhythm.     Pulses: Normal pulses.     Heart sounds: Normal heart sounds. No murmur heard. Musculoskeletal:     Right lower leg: No edema.     Left lower leg: No edema.  Skin:    General: Skin is warm and dry.  Neurological:     General: No focal deficit present.     Mental Status: He is alert.    CBC    Component Value Date/Time   WBC 16.5 (H) 06/01/2015 0515   RBC 3.06 (L) 06/01/2015 0515   HGB 10.3 (L) 06/01/2015 0515   HCT 29.7 (L) 06/01/2015 0515   PLT 209 06/01/2015 0515   MCV 97.1 06/01/2015 0515   MCH 33.7 06/01/2015 0515   MCHC 34.7 06/01/2015 0515   RDW 13.2 06/01/2015 0515      Latest Ref Rng & Units 06/01/2015    5:15 AM 05/27/2015   10:05 AM  BMP  Glucose 65 - 99 mg/dL 478  88   BUN 6 - 20 mg/dL 12  9   Creatinine 2.95 - 1.24 mg/dL 6.21  3.08   Sodium 657 - 145 mmol/L 136  140   Potassium 3.5 - 5.1 mmol/L 4.5  4.7   Chloride 101 - 111 mmol/L 107  107   CO2 22 - 32 mmol/L 22  25   Calcium 8.9 - 10.3 mg/dL 8.3  9.6    Chest imaging: CT Chest 08/30/18 1. Scattered small solid pulmonary nodules, all stable since 2013 chest CT, considered benign. 2. Mild centrilobular and paraseptal emphysema with  mild diffuse bronchial wall thickening, compatible with the provided history of COPD. 3. Two-vessel coronary atherosclerosis. 4. Nonobstructing left nephrolithiasis. 5. Small hiatal hernia.  PFT:    Latest Ref Rng & Units 02/06/2023    2:35 PM  PFT Results  FVC-Pre L 5.29   FVC-Predicted Pre %  110   FVC-Post L 5.34   FVC-Predicted Post % 111   Pre FEV1/FVC % % 69   Post FEV1/FCV % % 69   FEV1-Pre L 3.64   FEV1-Predicted Pre % 103   FEV1-Post L 3.66   DLCO uncorrected ml/min/mmHg 18.37   DLCO UNC% % 66   DLCO corrected ml/min/mmHg 18.37   DLCO COR %Predicted % 66   DLVA Predicted % 60   TLC L 8.14   TLC % Predicted % 109   RV % Predicted % 100     Labs:  Path:  Echo:  Heart Catheterization:  Assessment & Plan:   Chronic bronchitis, unspecified chronic bronchitis type (HCC) - Plan: predniSONE (DELTASONE) 10 MG tablet, azithromycin (ZITHROMAX) 250 MG tablet, ipratropium-albuterol (DUONEB) 0.5-2.5 (3) MG/3ML SOLN, Ambulatory Referral for DME  Stage 1 mild COPD by GOLD classification (HCC) - Plan: Ambulatory Referral for DME  Discussion: Darren Rivers is a 49 yea rold male, former smoker with GERD who returns to pulmonary clinic for COPD.  He has chronic bronchitis based on his clinical history with cough and congestion.   He is to continue spiriva 2 puffs twice daily. We will treat him with a prednisone taper and Zpak for his cough. He is to start nebulizer treatments with duonebs twice daily followed by flutter valve therapy for airway clearance.   Follow up in 6 months.  Melody Comas, MD [facility] Office: [phone]   Current Outpatient Medications:    aspirin EC 81 MG tablet, Take 81 mg by mouth every other day., Disp: , Rfl:    atorvastatin (LIPITOR) 40 MG tablet, Take 1 tablet by mouth daily., Disp: , Rfl:    azithromycin (ZITHROMAX) 250 MG tablet, Take as directed, Disp: 6 tablet, Rfl: 0   dextromethorphan-guaiFENesin  (MUCINEX DM) 30-600 MG 12hr tablet, Take 1 tablet by mouth 2 (two) times daily as needed for cough., Disp: , Rfl:    dorzolamide-timolol (COSOPT) 2-0.5 % ophthalmic solution, Place 1 drop into both eyes 2 (two) times daily., Disp: , Rfl:    ipratropium-albuterol (DUONEB) 0.5-2.5 (3) MG/3ML SOLN, Take 3 mLs by nebulization 2 (two) times daily., Disp: 360 mL, Rfl: 5   omeprazole (PRILOSEC) 40 MG capsule, Take 1 capsule by mouth daily., Disp: , Rfl:    predniSONE (DELTASONE) 10 MG tablet, Take 4 tablets (40 mg total) by mouth daily with breakfast for 3 days, THEN 3 tablets (30 mg total) daily with breakfast for 3 days, THEN 2 tablets (20 mg total) daily with breakfast for 3 days, THEN 1 tablet (10 mg total) daily with breakfast for 3 days., Disp: 30 tablet, Rfl: 0   tadalafil (CIALIS) 20 MG tablet, Take 20 mg by mouth daily., Disp: , Rfl:    Tiotropium Bromide Monohydrate (SPIRIVA RESPIMAT) 2.5 MCG/ACT AERS, Inhale 2 puffs into the lungs daily., Disp: 4 g, Rfl: 6   valACYclovir (VALTREX) 1000 MG tablet, Take 1 tablet by mouth 3 (three) times daily., Disp: , Rfl:

## 2023-06-03 ENCOUNTER — Encounter: Payer: Self-pay | Admitting: Pulmonary Disease

## 2023-06-28 DIAGNOSIS — M65332 Trigger finger, left middle finger: Secondary | ICD-10-CM | POA: Diagnosis not present

## 2023-07-26 DIAGNOSIS — M65332 Trigger finger, left middle finger: Secondary | ICD-10-CM | POA: Diagnosis not present

## 2023-07-31 DIAGNOSIS — M65332 Trigger finger, left middle finger: Secondary | ICD-10-CM | POA: Diagnosis not present

## 2023-10-08 DIAGNOSIS — H401331 Pigmentary glaucoma, bilateral, mild stage: Secondary | ICD-10-CM | POA: Diagnosis not present

## 2023-10-16 ENCOUNTER — Other Ambulatory Visit: Payer: Self-pay | Admitting: Primary Care

## 2023-10-22 DIAGNOSIS — Z1329 Encounter for screening for other suspected endocrine disorder: Secondary | ICD-10-CM | POA: Diagnosis not present

## 2023-10-22 DIAGNOSIS — E7849 Other hyperlipidemia: Secondary | ICD-10-CM | POA: Diagnosis not present

## 2023-10-22 DIAGNOSIS — R739 Hyperglycemia, unspecified: Secondary | ICD-10-CM | POA: Diagnosis not present

## 2023-10-22 DIAGNOSIS — N400 Enlarged prostate without lower urinary tract symptoms: Secondary | ICD-10-CM | POA: Diagnosis not present

## 2023-10-22 DIAGNOSIS — E875 Hyperkalemia: Secondary | ICD-10-CM | POA: Diagnosis not present

## 2023-10-22 DIAGNOSIS — E782 Mixed hyperlipidemia: Secondary | ICD-10-CM | POA: Diagnosis not present

## 2023-10-22 DIAGNOSIS — K219 Gastro-esophageal reflux disease without esophagitis: Secondary | ICD-10-CM | POA: Diagnosis not present

## 2023-10-30 DIAGNOSIS — Z6825 Body mass index (BMI) 25.0-25.9, adult: Secondary | ICD-10-CM | POA: Diagnosis not present

## 2023-10-30 DIAGNOSIS — Z0000 Encounter for general adult medical examination without abnormal findings: Secondary | ICD-10-CM | POA: Diagnosis not present

## 2023-10-30 DIAGNOSIS — D649 Anemia, unspecified: Secondary | ICD-10-CM | POA: Diagnosis not present

## 2023-10-30 DIAGNOSIS — Z23 Encounter for immunization: Secondary | ICD-10-CM | POA: Diagnosis not present

## 2023-10-30 DIAGNOSIS — Z0001 Encounter for general adult medical examination with abnormal findings: Secondary | ICD-10-CM | POA: Diagnosis not present

## 2023-10-30 DIAGNOSIS — E782 Mixed hyperlipidemia: Secondary | ICD-10-CM | POA: Diagnosis not present

## 2023-10-30 DIAGNOSIS — R739 Hyperglycemia, unspecified: Secondary | ICD-10-CM | POA: Diagnosis not present

## 2023-10-31 ENCOUNTER — Ambulatory Visit (INDEPENDENT_AMBULATORY_CARE_PROVIDER_SITE_OTHER): Payer: Medicare Other | Admitting: Pulmonary Disease

## 2023-10-31 ENCOUNTER — Encounter: Payer: Self-pay | Admitting: Pulmonary Disease

## 2023-10-31 VITALS — BP 128/84 | HR 67 | Ht 72.0 in | Wt 180.0 lb

## 2023-10-31 DIAGNOSIS — J42 Unspecified chronic bronchitis: Secondary | ICD-10-CM

## 2023-10-31 NOTE — Progress Notes (Signed)
 Synopsis: Referred in January 2024 for COPD by Selinda Flavin, MD  Subjective:   PATIENT ID: Darren Rivers GENDER: male DOB: 30-Apr-1951, MRN: [id number]  HPI  Chief Complaint  Patient presents with   Follow-up   Darren Rivers is a 42 yea rold male, former smoker with GERD who returns to pulmonary clinic for COPD.  Initial OV 09/13/22 He reports having chest congestion and cough over the past 5-6 years. He has been on flovent 2 puffs twice daily which helps him expectorate mucous. He denies wheezing. He has no night time awakenings due to cough. He has exertional dyspnea with walking to his mailbox which is about 484ft or going up the stairs in his home. No issues with bathing or getting dressed.   He denies seasonal allergies or sinus congestion with post nasal drainage. He does have GERD which is well controlled on omeprazole.   He is retired Production designer, theatre/television/film. Reports exposure to card board dusts. He reports episode of asbestos exposures from re-insulating the pipes in his house. He quit smoking in 2019. He has a 40 pack year smoking history.   OV 05/28/23 He was seen by Buelah Manis, NP 02/06/23 switched from advair to spiriva respimat 2.78mcg 2 puffs daily at last visit.   He continues to have hacking cough and chest congestion in the morning for a couple of hours. May have episode in the afternoons where it will flare up.   OV 10/31/23 He is concerned about the efficacy of his nebulizer treatment, specifically Duoneb (albuterol and ipratropium). He feels it might help slightly by inducing a cough but does not believe it significantly improves his symptoms. He uses the nebulizer twice daily, though not consistently, as he sometimes feels too tired in the evening, ensuring at least one use per day. He experiences a serious coughing session about half an hour to an hour after using the nebulizer, which he finds bothersome.  He is currently on Spiriva once daily and has tried using  a flutter valve device for about a year to help with congestion but did not find it beneficial. His main complaint previously was chest congestion and a hacking cough, particularly in the morning, which has slightly improved over time. No breathing difficulties at night, and he sleeps well once the congestion is cleared.  Past Medical History:  Diagnosis Date   Arthritis    COPD (chronic obstructive pulmonary disease) (HCC)    GERD (gastroesophageal reflux disease)      No family history on file.   Social History   Socioeconomic History   Marital status: Married    Spouse name: Not on file   Number of children: Not on file   Years of education: Not on file   Highest education level: Not on file  Occupational History   Not on file  Tobacco Use   Smoking status: Former    Current packs/day: 0.00    Types: Cigarettes    Quit date: 09/04/2017    Years since quitting: 6.1    Passive exposure: Past   Smokeless tobacco: Never  Substance and Sexual Activity   Alcohol use: Not on file    Comment: occasionally    Drug use: No   Sexual activity: Not on file  Other Topics Concern   Not on file  Social History Narrative   Not on file   Social Drivers of Health   Financial Resource Strain: Not on file  Food Insecurity: Not on file  Transportation Needs: Not on file  Physical Activity: Not on file  Stress: Not on file  Social Connections: Not on file  Intimate Partner Violence: Not on file     No Known Allergies   Outpatient Medications Prior to Visit  Medication Sig Dispense Refill   aspirin EC 81 MG tablet Take 81 mg by mouth every other day.     atorvastatin (LIPITOR) 40 MG tablet Take 1 tablet by mouth daily.     azithromycin (ZITHROMAX) 250 MG tablet Take as directed 6 tablet 0   dextromethorphan-guaiFENesin (MUCINEX DM) 30-600 MG 12hr tablet Take 1 tablet by mouth 2 (two) times daily as needed for cough.     dorzolamide-timolol (COSOPT) 2-0.5 % ophthalmic solution Place  1 drop into both eyes 2 (two) times daily.     ipratropium-albuterol (DUONEB) 0.5-2.5 (3) MG/3ML SOLN Take 3 mLs by nebulization 2 (two) times daily. 360 mL 5   omeprazole (PRILOSEC) 40 MG capsule Take 1 capsule by mouth daily.     tadalafil (CIALIS) 20 MG tablet Take 20 mg by mouth daily.     Tiotropium Bromide Monohydrate (SPIRIVA RESPIMAT) 2.5 MCG/ACT AERS INHALE 2 PUFFS BY MOUTH INTO THE LUNGS DAILY 1 each 6   valACYclovir (VALTREX) 1000 MG tablet Take 1 tablet by mouth 3 (three) times daily.     No facility-administered medications prior to visit.   Review of Systems  Constitutional:  Negative for chills, fever, malaise/fatigue and weight loss.  HENT:  Negative for congestion, sinus pain and sore throat.   Eyes: Negative.   Respiratory:  Positive for cough and sputum production (minimal). Negative for hemoptysis, shortness of breath and wheezing.   Cardiovascular:  Negative for chest pain, palpitations, orthopnea, claudication and leg swelling.  Gastrointestinal:  Negative for abdominal pain, heartburn, nausea and vomiting.  Genitourinary: Negative.   Musculoskeletal:  Negative for joint pain and myalgias.  Skin:  Negative for rash.  Neurological:  Negative for weakness.  Endo/Heme/Allergies: Negative.   Psychiatric/Behavioral: Negative.     Objective:   Vitals:   10/31/23 0902  BP: 128/84  Pulse: 67  SpO2: 97%  Weight: 180 lb (81.6 kg)  Height: 6' (1.829 m)   Physical Exam Constitutional:      General: He is not in acute distress. HENT:     Head: Normocephalic and atraumatic.  Eyes:     Conjunctiva/sclera: Conjunctivae normal.  Cardiovascular:     Rate and Rhythm: Normal rate and regular rhythm.     Pulses: Normal pulses.     Heart sounds: Normal heart sounds. No murmur heard. Musculoskeletal:     Right lower leg: No edema.     Left lower leg: No edema.  Skin:    General: Skin is warm and dry.  Neurological:     General: No focal deficit present.     Mental  Status: He is alert.    CBC    Component Value Date/Time   WBC 16.5 (H) 06/01/2015 0515   RBC 3.06 (L) 06/01/2015 0515   HGB 10.3 (L) 06/01/2015 0515   HCT 29.7 (L) 06/01/2015 0515   PLT 209 06/01/2015 0515   MCV 97.1 06/01/2015 0515   MCH 33.7 06/01/2015 0515   MCHC 34.7 06/01/2015 0515   RDW 13.2 06/01/2015 0515      Latest Ref Rng & Units 06/01/2015    5:15 AM 05/27/2015   10:05 AM  BMP  Glucose 65 - 99 mg/dL 161  88   BUN 6 - 20  mg/dL 12  9   Creatinine 5.78 - 1.24 mg/dL 4.69  6.29   Sodium 528 - 145 mmol/L 136  140   Potassium 3.5 - 5.1 mmol/L 4.5  4.7   Chloride 101 - 111 mmol/L 107  107   CO2 22 - 32 mmol/L 22  25   Calcium 8.9 - 10.3 mg/dL 8.3  9.6    Chest imaging: CT Chest 10/31/23 1. Lung-RADS 2, benign appearance or behavior. Continue annual screening with low-dose chest CT without contrast in 12 months. 2. Two-vessel coronary atherosclerosis. 3. Small hiatal hernia. 4. Aortic Atherosclerosis (ICD10-I70.0) and Emphysema (ICD10-J43.9).  CT Chest 08/30/18 1. Scattered small solid pulmonary nodules, all stable since 2013 chest CT, considered benign. 2. Mild centrilobular and paraseptal emphysema with mild diffuse bronchial wall thickening, compatible with the provided history of COPD. 3. Two-vessel coronary atherosclerosis. 4. Nonobstructing left nephrolithiasis. 5. Small hiatal hernia.  PFT:    Latest Ref Rng & Units 02/06/2023    2:35 PM  PFT Results  FVC-Pre L 5.29   FVC-Predicted Pre % 110   FVC-Post L 5.34   FVC-Predicted Post % 111   Pre FEV1/FVC % % 69   Post FEV1/FCV % % 69   FEV1-Pre L 3.64   FEV1-Predicted Pre % 103   FEV1-Post L 3.66   DLCO uncorrected ml/min/mmHg 18.37   DLCO UNC% % 66   DLCO corrected ml/min/mmHg 18.37   DLCO COR %Predicted % 66   DLVA Predicted % 60   TLC L 8.14   TLC % Predicted % 109   RV % Predicted % 100     Labs:  Path:  Echo:  Heart Catheterization:  Assessment & Plan:   Chronic bronchitis,  unspecified chronic bronchitis type (HCC)  Discussion: Darren Rivers is a 54 yea rold male, former smoker with GERD who returns to pulmonary clinic for COPD.  Chronic Obstructive Pulmonary Disease (COPD) Patient reports no significant improvement with nebulizer treatments and has inconsistent use. No significant benefit from flutter valve device. Currently on Spiriva once daily. -Use duonebs and flutter valve as needed -Start trial of trelegy ellipta 1 puff daily, rinse mouth out after each use - Hold spiriva while using trelegy - He is to message [place] via MyChart if he wants to continue on trelegy.  Lung Cancer Screening Patient is enrolled in lung cancer screening program due to history of smoking. Last CT scan was in March of the previous year and was unremarkable. -Scheduled for annual low-dose CT scan in two weeks. -Instruct patient to contact provider via MyChart messaging if no results are received within 2-3 weeks of scan.  Follow up in 1 year, call sooner if needed  Melody Comas, MD [facility] Office: [phone]   Current Outpatient Medications:    aspirin EC 81 MG tablet, Take 81 mg by mouth every other day., Disp: , Rfl:    atorvastatin (LIPITOR) 40 MG tablet, Take 1 tablet by mouth daily., Disp: , Rfl:    azithromycin (ZITHROMAX) 250 MG tablet, Take as directed, Disp: 6 tablet, Rfl: 0   dextromethorphan-guaiFENesin (MUCINEX DM) 30-600 MG 12hr tablet, Take 1 tablet by mouth 2 (two) times daily as needed for cough., Disp: , Rfl:    dorzolamide-timolol (COSOPT) 2-0.5 % ophthalmic solution, Place 1 drop into both eyes 2 (two) times daily., Disp: , Rfl:    ipratropium-albuterol (DUONEB) 0.5-2.5 (3) MG/3ML SOLN, Take 3 mLs by nebulization 2 (two) times daily., Disp: 360 mL, Rfl: 5  omeprazole (PRILOSEC) 40 MG capsule, Take 1 capsule by mouth daily., Disp: , Rfl:    tadalafil (CIALIS) 20 MG tablet, Take 20 mg by mouth daily., Disp: , Rfl:     Tiotropium Bromide Monohydrate (SPIRIVA RESPIMAT) 2.5 MCG/ACT AERS, INHALE 2 PUFFS BY MOUTH INTO THE LUNGS DAILY, Disp: 1 each, Rfl: 6   valACYclovir (VALTREX) 1000 MG tablet, Take 1 tablet by mouth 3 (three) times daily., Disp: , Rfl:

## 2023-10-31 NOTE — Patient Instructions (Addendum)
 Try Trelegy ellipta 1 puff daily - rinse mouth out well after each use - Please send MyChart message if you would like to continue on this inhaler  Stop spiriva while using trelegy  Continue to use duoneb nebulizer treatments as needed along with flutter valve  Our team will message you with the results of the CT Lung Cancer Screening CT Chest   Follow up in 1 year, call sooner if needed

## 2023-11-16 ENCOUNTER — Ambulatory Visit (HOSPITAL_COMMUNITY)
Admission: RE | Admit: 2023-11-16 | Discharge: 2023-11-16 | Disposition: A | Discharge status: Home / Self Care | Payer: Medicare Other | Source: Ambulatory Visit | Attending: Acute Care | Admitting: Acute Care

## 2023-11-16 DIAGNOSIS — Z122 Encounter for screening for malignant neoplasm of respiratory organs: Secondary | ICD-10-CM | POA: Diagnosis not present

## 2023-11-16 DIAGNOSIS — Z87891 Personal history of nicotine dependence: Secondary | ICD-10-CM | POA: Insufficient documentation

## 2023-11-19 DIAGNOSIS — D126 Benign neoplasm of colon, unspecified: Secondary | ICD-10-CM | POA: Diagnosis not present

## 2023-12-12 ENCOUNTER — Other Ambulatory Visit: Payer: Self-pay

## 2023-12-12 DIAGNOSIS — Z87891 Personal history of nicotine dependence: Secondary | ICD-10-CM

## 2023-12-12 DIAGNOSIS — Z122 Encounter for screening for malignant neoplasm of respiratory organs: Secondary | ICD-10-CM

## 2024-01-17 DIAGNOSIS — K5730 Diverticulosis of large intestine without perforation or abscess without bleeding: Secondary | ICD-10-CM | POA: Diagnosis not present

## 2024-01-17 DIAGNOSIS — K642 Third degree hemorrhoids: Secondary | ICD-10-CM | POA: Diagnosis not present

## 2024-01-17 DIAGNOSIS — E7800 Pure hypercholesterolemia, unspecified: Secondary | ICD-10-CM | POA: Diagnosis not present

## 2024-01-17 DIAGNOSIS — K649 Unspecified hemorrhoids: Secondary | ICD-10-CM | POA: Diagnosis not present

## 2024-01-17 DIAGNOSIS — Z860100 Personal history of colon polyps, unspecified: Secondary | ICD-10-CM | POA: Diagnosis not present

## 2024-01-17 DIAGNOSIS — K648 Other hemorrhoids: Secondary | ICD-10-CM | POA: Diagnosis not present

## 2024-01-17 DIAGNOSIS — J449 Chronic obstructive pulmonary disease, unspecified: Secondary | ICD-10-CM | POA: Diagnosis not present

## 2024-01-17 DIAGNOSIS — Z1211 Encounter for screening for malignant neoplasm of colon: Secondary | ICD-10-CM | POA: Diagnosis not present

## 2024-04-10 DIAGNOSIS — H401111 Primary open-angle glaucoma, right eye, mild stage: Secondary | ICD-10-CM | POA: Diagnosis not present

## 2024-05-13 DIAGNOSIS — Z23 Encounter for immunization: Secondary | ICD-10-CM | POA: Diagnosis not present

## 2024-05-30 DIAGNOSIS — Z23 Encounter for immunization: Secondary | ICD-10-CM | POA: Diagnosis not present

## 2024-06-01 ENCOUNTER — Other Ambulatory Visit: Payer: Self-pay | Admitting: Pulmonary Disease

## 2024-06-01 DIAGNOSIS — J42 Unspecified chronic bronchitis: Secondary | ICD-10-CM

## 2024-07-02 ENCOUNTER — Other Ambulatory Visit: Payer: Self-pay | Admitting: Primary Care

## 2024-09-25 ENCOUNTER — Encounter: Payer: Self-pay | Admitting: *Deleted

## 2024-09-25 ENCOUNTER — Telehealth: Payer: Self-pay | Admitting: Pulmonary Disease

## 2024-09-25 NOTE — Telephone Encounter (Signed)
 Fax received from Dr. Elspeth Her with [facility] to perform a right reverse total shoulder arthropasty under choice anesthesia on patient.  Patient needs surgery clearance. Surgery is March 2026 . Patient was seen on 10/31/23. Office protocol is a risk assessment can be sent to surgeon if patient has been seen in 60 days or less.   Pt will need ov for risk assessment. I sent him msg asking him to call for appt via mychart.   Routing to clearance pool for now.

## 2024-10-08 ENCOUNTER — Ambulatory Visit: Payer: Self-pay

## 2024-10-08 NOTE — Telephone Encounter (Signed)
 FYI Only or Action Required?: FYI only for provider: ED advised.  Patient is followed in Pulmonology for COPD, last seen on 10/31/2023 by Kara Dorn NOVAK, MD.  Called Nurse Triage reporting Shortness of Breath.  Symptoms began a week ago.  Interventions attempted: Nothing.  Symptoms are: rapidly worsening.  Triage Disposition: Go to ED Now (Notify PCP)  Patient/caregiver understands and will follow disposition?: Yes       Reason for Disposition  Injury (or injuries) that need emergency care  Answer Assessment - Initial Assessment Questions This RN recommended pt be examined in hospital asap. Pt verbalized understanding, will head to ER via loved one. Advised call 911 if any new or worsening symptoms. Advised follow up with pulm thereafter.    1. MECHANISM: How did the fall happen?     About a week ago slipped on ice and fell and landed on my back, think may have broken a rib, symptoms increased from that point with SOB worse than usual, chest congestion, and chest pain  3. ONSET: When did the fall happen? (e.g., minutes, hours, or days ago)     A week ago  4. LOCATION: What part of the body hit the ground? (e.g., back, buttocks, head, hips, knees, hands, head, stomach)     Just back  5. INJURY: Did you hurt (injure) yourself when you fell? If Yes, ask: What did you injure? Tell me more about this? (e.g., body area; type of injury; pain severity)     Think may have broken a rib  6. PAIN: Is there any pain? If Yes, ask: How bad is the pain? (e.g., Scale 0-10; or none, mild,      Chest pain lower right lung, tops of lungs with congestion  7. SIZE: For cuts, bruises, or swelling, ask: How large is it? (e.g., inches or centimeters)      Not noticed bruising  9. OTHER SYMPTOMS: Do you have any other symptoms? (e.g., dizziness, fever, weakness; new-onset or worsening).      Pt speaking in phrases, confirms consistent worse SOB than usual, significantly  worse, difficult to take deep breath, states not quite struggling with every word  Denies: Struggling to breathe Bleeding Cold/clammy skin, too weak to stand Confusion/slurred speech Severe weakness Unable to bear weight LOC Dizziness Pallor  Protocols used: Falls and [facility]

## 2024-10-08 NOTE — Telephone Encounter (Signed)
 Noted. Nothing further needed.

## 2024-11-17 ENCOUNTER — Ambulatory Visit (HOSPITAL_COMMUNITY)
